# Patient Record
Sex: Female | Born: 1985 | Race: Black or African American | Hispanic: No | Marital: Single | Smoking: Never smoker
Health system: Southern US, Community
[De-identification: ages and names within clinical notes are randomized; demographics above are authoritative.]

## PROBLEM LIST (undated history)

## (undated) DIAGNOSIS — I82409 Acute embolism and thrombosis of unspecified deep veins of unspecified lower extremity: Secondary | ICD-10-CM

## (undated) DIAGNOSIS — J45909 Unspecified asthma, uncomplicated: Secondary | ICD-10-CM

## (undated) DIAGNOSIS — D6859 Other primary thrombophilia: Secondary | ICD-10-CM

## (undated) DIAGNOSIS — E079 Disorder of thyroid, unspecified: Secondary | ICD-10-CM

## (undated) HISTORY — PX: ELBOW SURGERY: SHX618

## (undated) HISTORY — DX: Disorder of thyroid, unspecified: E07.9

## (undated) NOTE — H&P (Signed)
Formatting of this note is different from the original.  [facility]    History and Physical    Assessment   Active Hospital Problems    Oligohydramnios antepartum, third trimester, fetus 14    13 year old G2P1001 @ [redacted]W[redacted]D with oligohydramnios, postdates, and previous C/S. Patient desires TOLAC. GBS NEG. BPP in office today with AFI 3.5cm. Sent to L&D for admission and IOL.    Plan   Admit to L&D. Will start pitocin IOL. Pain management as needed. Continuous monitoring.    History   Margaret Ellis is a 38 y.o. female G2P1001 at [redacted]w[redacted]d weeks with LMP 04/12/2014 with estimated due date of 01/17/2015, by Last Menstrual Period who presents with postdates and oligohydramnios.  History of Present Illness  Uncomplicated prenatal course. Sent to L&D for admission after [place] in office showed oligohydramnios. Previous C/S for breech and oligo. Patient desires TOLAC. Review of risks. All questions answered.    Membrane Status: AROM  Planned Tubal Ligation:      No  Obstetric History    G2   P1   T1   P0   A0   TAB0   SAB0   E0   M0   L1      # Outcome Date GA Lbr Len/2nd Weight Sex Delivery Anes PTL Lv   2 Current            1 Term 03/2013    F CS-LTranv Spinal N Y      Complications: Breech birth,Oligohydramnios       Past Medical History   Diagnosis Date   ? Disease of thyroid gland      Past Surgical History   Procedure Laterality Date   ? Cesarean section     ? Fractured elbow Left      at age 2     No Known Allergies  Prior to Admission medications    Medication Sig Start Date End Date Taking  Authorizing Provider   Cholecalciferol (VITAMIN D PO) Take 1 tablet by mouth.   Yes Historical Provider, MD   levothyroxine sodium (SYNTHROID, LEVOTHROID) 50 mcg tablet Take 50 mcg by mouth daily. 12/25/14  Yes Historical Provider, MD   Prenatal Multivit-Min-Fe-FA (PRENATAL VITAMINS PO) Take 1 tablet by mouth.   Yes Historical Provider, MD     Family History   Problem Relation Age of  Onset   ? Diabetes Paternal Grandmother    ? Diabetes Maternal Grandmother    ? Hypothyroidism Mother      Social History     Social History   ? Marital status: Married     Spouse name: richard   ? Number of children: 1   ? Years of education: N/A     Social History Main Topics   ? Smoking status: Never Smoker   ? Smokeless tobacco: Not on file   ? Alcohol use No   ? Drug use: Not on file   ? Sexual activity: Yes     Partners: Male     Birth control/ protection: OCP     Other Topics Concern   ? Not on file     Social History Narrative   ? No narrative on file     Review of Systems   Constitutional: Negative.    HENT: Negative.    Eyes: Negative.    Respiratory: Negative.    Cardiovascular: Negative.    Gastrointestinal: Negative.  Endocrine: Negative.    Genitourinary:        Cramping   Musculoskeletal: Negative.    Skin: Negative.    Allergic/Immunologic: Negative.    Neurological: Negative.    Hematological: Negative.    Psychiatric/Behavioral: Negative.      Physical Examination   Temp:  [97.9 F (36.6 C)] 97.9 F (36.6 C)  Heart Rate:  [69-83] 81  Resp:  [16] 16  BP: (125-144)/(85-96) 144/96  Pain Score:   6      Latest cervical exam by OB Examiner: dr Barbaraann Boys (01/25/15 1327)  Dilation: 6  Effacement (%): 100  Station: 0   AROM - scant clear fluid            Physical Exam   Constitutional: She is oriented to person, place, and time. She appears well-developed.   HENT:   Head: Normocephalic.   Cardiovascular: Normal rate and regular rhythm.    Pulmonary/Chest: Effort normal.   Abdominal: Soft. Bowel sounds are normal.   Genitourinary:   Genitourinary Comments: 6/100/-1   Musculoskeletal: Normal range of motion.   Neurological: She is oriented to person, place, and time.   Psychiatric: She has a normal mood and affect.   Nursing note and vitals reviewed.    Fetal monitoring:    Fetal Heart Rate  Mode: External [place] (01/25/15 1329 : Incoming [employer] Acute Interface)  Baseline Rate: 145 bpm (01/25/15 1329 :  Incoming [employer] Acute Interface)  Baseline Classification: No Baseline Change (01/25/15 1329 : Incoming [employer] Acute Interface)  Variability: Moderate 6-25bpm (01/25/15 1329 : Incoming [employer] Acute Interface)  Accelerations: Absent (01/25/15 1329 : Incoming [employer] Acute Interface)  Decelerations: None (01/25/15 1329 : Incoming [employer] Acute Interface)  OB Fetal Movement: Observed (01/25/15 1201 : Incoming [employer] Acute Interface)  FHR Comments: pt up walking on remote monitoring (01/25/15 1141 : Incoming [employer] Acute Interface)      Uterine activity:  Mode: External (01/25/15 1329 : Incoming [employer] Acute Interface)  Contraction Frequency (min): 2-3 (01/25/15 1329 : Incoming [employer] Devices Acute Interface)  Contraction Duration (sec): 40-60 (01/25/15 1329 : Incoming C[employer] Devices Acute Interface)  Contraction Quality: Moderate (01/25/15 1329 : Incoming C[employer] Devices Acute Interface)     Results   Prenatal Labs:  BLOOD GROUPING (no units)   Date Value   06/05/2014 O    RH FACTOR (no units)   Date Value   06/05/2014 Positive    ANTIBODY SCREEN (no units)   Date Value   06/05/2014 Negative       HGB   Date Value   01/25/2015 13.1 gm/dL   16/01/9603 54.0 g/dL   98/02/9146 82.9 g/dL    HEMATOCRIT (%)   Date Value   10/25/2014 38.3   06/05/2014 43.1     HCT (%)   Date Value   01/25/2015 39.9    No results found for: PROTUR     STREP GP B CULT/DNA PROBE (no units)   Date Value   12/26/2014 Negative       NEISSERIA GONORRHOEAE BY PCR (no units)   Date Value   06/05/2014 Negative    CHLAMYDIA TRACHOMATIS, NAA (no units)   Date Value   06/05/2014 Negative    RPR (no units)   Date Value   06/05/2014 Nonreactive       RUBELLA ANTIBODIES, IGG (no units)   Date Value   06/05/2014 Immune      HEPATITIS B SURFACE AG (no  units)   Date Value   06/05/2014 Negative    HIV (no units)   Date Value   06/05/2014 Negative     No results found for: PPD No results found for: TSH No results found for: HMPAP     No  results found for: CF No results found for: CANAVAN No components found for: BETATHALASSEMIAHB   No results found for: SICKLE  No results found for: TAYSACHS      (Electronically Signed:)Iris Oletha Blend, MD  01/25/2015 1:35 PM  Electronically signed by Kalman Drape, MD at 01/25/2015  1:36 PM EDT

## (undated) NOTE — Unmapped External Note (Signed)
Formatting of this note might be different from the original.  Cesarean Operative Note  [facility]    Date of Service: 01/25/2015    Pre-Operative Diagnosis:  1.  Intrauterine pregnancy at [redacted]w[redacted]d  2.  Oligohydramnios  3.  Previous C/S, Failed TOLAC  4. Arrest of Labor    Post-Operative Diagnosis:  1.  Intrauterine pregnancy at [redacted]w[redacted]d, delivered  2.  Oligohydramnios  3. Previous C/S, Failed TOLAC  4. Arrest of Labor    Procedure: Repeat low transverse Cesarean via Pfannenstiel incision with double-layer uterine closure    Anesthesia: Epidural    Surgeon: Kalman Drape    Assistant:     EBL: 700 cc    Fluids: 800 cc of crystalloid    Urine output: 150 cc    Complications: None apparent    Indications: Margaret Ellis is a 71 y.o. G2P2002 at [redacted]w[redacted]d weeks gestation who presented with oligohydramnios and postdates gestation.  She desired TOLAC after previous C/S.  She had subsequent arrest of labor.  Risks, benefits and alternatives of the above stated procedure were discussed with the patient and she agreed to proceed.    Findings: Single liveborn female infant, weight 7-10 Ibs, Apgars 9 at 1 min and 9 at 5 minutes in cephalic presentation.  Meconium-stained fluid.  Placenta intact with 3 vessel cord.  Normal uterus, fallopian tubes and ovaries bilaterally.  LOP presentation. Nuchal cord x 1. True Knot.    Procedure: The patient was taken to the operating room where epidural was re-bolused.  She was placed in dorsal supine position with a leftward-tilt.  A transverse skin incision was made with the scalpel and carried through to the underlying fascia which was incised in the midline and the incision extended laterally with Mayo scissors.  The fascia was dissected off the underlying rectus muscles with sharp and blunt dissection.  The peritoneum was entered sharply and the incision extended with care to avoid the bladder.  The bladder  blade was placed and the lower uterine segment visualized.  The vesicouterine peritoneum was entered sharply and the incision extended to create the bladder flap.  The bladder blade was replaced and the lower uterine segment was incised in a transverse fashion and the incision extended laterally with finger fracture.  The infant, which was in cephalic LOP presentation was delivered atraumatically, the nuchal cord was reduced. Delayed cord clamping was done. The cord was clamped and cut, and the infant handed off to the waiting Neonatologist.  The placenta was delivered with cord traction.  The uterus was exteriorized and cleared of all clots and debris.  The uterus was noted to be firm, and with uterine massage and Pitocin through the running IV, good uterine tone was achieved.  The hysterotomy was repaired with 0 Vicryl in a running locked fashion.  A second layer of 0 Vicryl was used to imbricate the incision and excellent hemostasis was noted. The the pelvis was irrigated and suctioned and the uterus returned to the abdomen.  The bilateral pericolic gutters were cleared of all clots and debris.  The hysterotomy was re-examined and good hemostasis was noted. The bladder flap was examined and no areas of bleeding noted..  The rectus muscles were examined and re-approximated with 3-0 Chromic.  The fascia was re-approximated with 0 Vicryl in a running fashion.  The subcutaneous tissue was irrigated and areas of bleeding controlled with cautery.  The subcutaneous  tissue was 0 Vicryl in a running fashion.  The skin was closed with staples and sterile dressing applied.  The patient tolerated the procedure well.  Sponge, lap and needle counts were correct at the end of the case.  The patient was taken to the recovery room in stable condition.    Specimens: None    Kalman Drape, MD  01/25/2015 / 9:09 PM      Electronically signed by Kalman Drape, MD at 01/25/2015  9:20 PM EDT

## (undated) NOTE — Anesthesia Postprocedure Evaluation (Signed)
Formatting of this note might be different from the original.    Patient: Margaret Ellis  Procedure(s):  CESAREAN SECTION  Anesthesia type: epidural    Patient location:  PACU  Patient participation:  Patient able to participate in this evaluation at age appropriate level.    Vital signs reviewed and can be found in nursing documentation.    Post vital signs:   stable  Level of consciousness:   awake, alert and oriented    Post-anesthesia pain:   adequate analgesia  Airway patency:   patent  Respiratory:   unassisted, respiration function adequate, spontaneous ventilation  Cardiovascular:   stable, blood pressure acceptable and heart rate acceptable  Hydration:   adequate hydration  Temperature: temperature adequate >96.39F  PONV:  nausea and vomiting controlled  Regional anesthesia: full recovery from regional anesthesia has not occurred and is not expected at this time    Anesthetic complications:   no  Electronically signed by Nuala Alpha, MD at 01/25/2015  8:55 PM EDT

## (undated) NOTE — Anesthesia Procedure Notes (Signed)
Associated Order(s): EPIDURAL BLOCK  Formatting of this note might be different from the original.  Epidural/Caudal Block  Patient location during procedure: OB  Date/Time: 01/25/2015 3:20 PM  Reason for block: labor pain management  Patient monitoring: HR and NIBP    Staff  Anesthesiologist: ANDRIES, LUKE  Performed by: Anesthesiologist     Epidural  Patient position: sitting  Completed: patient identified, surgical consent, pre-op evaluation, timeout performed, IV checked, risks and benefits discussed, monitors and equipment checked and hand hygiene performed  Prep: chlorhexidine gluconate and isopropyl alcohol, sterile technique and prep, sterile gloves used, sterile drape used, mask used and hand hygiene performed    Approach: midline  Location: lumbar, 3-4  Identification technique: loss of resistance - air  Needle  Needle type: Tuohy   Needle gauge: 17g  Needle length: 90mm / 3.5 in  Catheter type: multi-port  Catheter size: 22g  Test dose: negative and lidocaine 2% with epinephrine  Assessment  Sensory level T4  Post-procedure: sterile dressing/s applied and EBL minimal unless otherwise noted  Patient tolerance: Patient tolerated the procedure well with no immediate complications.      Electronically signed by Nuala Alpha, MD at 01/25/2015  3:20 PM EDT

## (undated) NOTE — Discharge Summary (Signed)
Formatting of this note is different from the original.  [facility]    Obstetrics Discharge Note    Discharge Details   Admit date:         01/25/2015  Discharge date and time:       01/28/2015  Hospital LOS:    3 days    Active Hospital Problems    Diagnosis Date Noted POA   ? Delivered by cesarean section 01/28/2015 No     Resolved Hospital Problems    Diagnosis Date Noted Date Resolved POA   ? Oligohydramnios antepartum, third trimester, fetus 1 01/25/2015 01/28/2015 Yes       Hospital Course     Hospital Physicians:  Physicians involved in care during this hospitalization  Attending Provider: Kalman Drape, MD  Admitting Provider: Kalman Drape, MD  Anesthesiologist: Nuala Alpha, MD  Consulting Physician: Erskine Speed Consult To [facility]    Admitting Diagnosis:  1.  Intrauterine pregnancy at [redacted]w[redacted]d  2.  Oligohydramnios  3.  Previous Cesarean section    Discharging Diagnosis:  1.  Intrauterine pregnancy at [redacted]w[redacted]d, delivered  2.  Oligohydramnios  3.  Arrest of dilation  4.  Previous Cesarean section, failed TOLAC    Procedures:  Bedside Procedures     No orders found       Procedure(s) (LRB):  CESAREAN SECTION (N/A)    01/25/2015    Surgeon(s):  Kalman Drape, MD  -------------------    Admission History:  The patient is a 39 y.o. G2P1001 who was admitted at [redacted]w[redacted]d for induction of labor for oligohydramnios with an AFI of 3 cm.  Previous Cesarean section for breech and oligohydramnios.  She progressed to 9 cm then had arrest of dilation and fetal decelerations and so Cesarean section ws recommended.    Procedure Details: The patient underwent repeat low-transverse Cesarean section on 01/25/15.  Procedure was uncomplicated with an estimated blood loss of 800cc.  Findings included: LOP presentation, meconium-stained fluid, nuchal cord and true knot in cord.    Delivery:  Date of Birth: 01/25/2017    Time of Birth: 7:57 PM      Information for the  patient's newborn:  Tedra, [place] [[id number]]   female    Birth History   ? Birth     Length: 20.08" (51 cm)     Weight: 7 lb 10 oz (3.459 kg)     HC 34 cm (13.39")   ? Apgar     One: 9     Five: 9   ? Delivery Method: C-Section, Low Transverse   ? Gestation Age: 5 1/7 wks   ? Duration of Labor: 1st: 3h 30m / 2nd: 2h 34m     Hospital Course:        The patient's post-op course was complicated by post-partum hemorrhage requiring bedside uterine evacuation.  Her hemoglobin dropped below 7 and she was transfused wiht 2u PRBC with appropriate rise of Hgb.  By POD#3 the patient was tolerating a regular diet, voiding spontaneously, ambulating without difficulty, regaining bowel function, with pain well-controlled on PO medications and so she was deemed stable for discharge to home.    Discharge Instructions     Other Instructions     Discharge instructions       No heavy lifting >15 lbs or strenuous exercise for 6 weeks, nothing in the vagina, no sex/tampons/douching for 6w, no driving while on narcotic pain medications.  Call the office for any fevers >100.4, heavy vaginal bleeding, severe pain, nausea or vomiting, problems with urination, breast concerns, problems with incision, concerning mood changes or any other concerns.    Return to the office for incision check in 1 week and post-partum visit in 6 weeks.           Appointments which have been scheduled     Jan 31, 2015  7:00 AM EDT   LD INDUCTION IP with [facility] LD LABOR RM 3, [facility] LD RN 3   [facility] L&D Procedures [facility])    [address]  [place] [address]   [phone]           Unresulted Labs     Order Current Status    Prepare RBC, 2 Units Preliminary result       Current Discharge Medication List     NEW medications    Details   HYDROcodone-acetaminophen (NORCO) 5-325 mg per tablet Take one tablet to two tablets by mouth every 4 (four) hours as needed.  Start date: 01/28/2015, End date: 02/07/2015      ibuprofen (ADVIL,MOTRIN) 600 mg tablet Take one tablet (600 mg total) by mouth every 6 (six) hours as needed for Pain.  Start date: 01/28/2015     iron-vitamin C (VITRON-C) 65-125 mg TABS per tablet Take one tablet by mouth daily.  Start date: 01/28/2015       CONTINUED medications    Details   Cholecalciferol (VITAMIN D PO) Take 1 tablet by mouth.     levothyroxine sodium (SYNTHROID, LEVOTHROID) 50 mcg tablet Take 50 mcg by mouth daily.     Prenatal Multivit-Min-Fe-FA (PRENATAL VITAMINS PO) Take 1 tablet by mouth.         Electronically signed:  Chelsea Primus, MD  01/28/2015 / 8:55 AM  Electronically signed by Izell Carolina at 01/28/2015  9:01 AM EDT

## (undated) NOTE — L&D Delivery Note (Signed)
Formatting of this note is different from the original.  [facility]    Labor & Delivery Summary    Maternal History   Gravida/Para:  [id number]  EDD:   01/17/2015, by Last Menstrual Period  STREP GP B CULT/DNA PROBE   Date/Time Value Ref Range Status   12/26/2014 Negative Negative Final     Active Hospital Problems    Oligohydramnios antepartum, third trimester, fetus 1    Labor Information     Cervical ripening:             Induction: Oxytocin    Indications for Induction:          Post-term Gestation     Induction Date:      Induction Time:      Augmentation:      Augmentation Date:      Augmentation Time:      Antibiotics:                    No     ROM:  Rupture date: 01/25/2015    Rupture time: 1:28 PM    ROM method:          AROM   Amniotic fluid:                               Clear  Odor: None    Complete Cervical Dilation:     01/25/2015   5:13 PM              Labor EventTimes:                     First Stage: 3  hour(s), 43  minute(s)  Second Stage: 2  hour(s), 44  minute(s)  Third Stage: 0  hour(s), 1  minute(s)    Complications: Failure To Progress In First Stage      Anesthesia Type:        Epidural     Delivery Information:   Delivery Clinician:    Kalman Drape                  Other Staff:  [place] D;SIMMONS, Vallarie Mare  Delivery Assist;Delivery Nurse      Episiotomy: None    Lacerations:  Perineal:    Repaired:         Periurethral:        Repaired:       Labial:    Repaired:       Sulcus:    Repaired:       Vaginal:    Repaired:       Cervical:     Repaired:         C-Section Details:  Primary/repeat: Repeat    Priority: Routine    Indications:  Arrest of Dilation    Incision type: Low Transverse      Total estimated blood loss (mL): 700      Baby Information:     Date of Birth: 01/25/2017    Time of Birth: 7:57 PM      Method of Delivery:                C-Section, Low Transverse   Born en Route:  VBAC:                                      Details of Assisted Delivery (if applicable)   Forceps attempted  No    Forceps indication:      Forceps type:      Application location:      Number of pulls:      Total application time:      Applied by:      Failed         Vacuum attempted  No     Vacuum indication:      Vacuum type:      Application location:      First Attempt     Time applied:      Time removed:      Second Attempt    Time applied:      Time removed:      Third Attempt    Time applied:      Time removed:      Number of pulls:      Number of pop-offs:      HIgh-end pressure range:      Total application time:      Applied by:      Failed         Presentation/Position:  Vertex ;   ; Left     Details of Shoulder Dystocia (if applicable)   Dystocia Present                        No   Time head delivered:                  01/25/2015  7:57 PM   Time body delivered:                   7:57 PM   Gentle attempt at traction, assisted by maternal expulsive forces:       If no, why:     Anterior shoulder:     Time recognized:       Time help called:    Called by:      Time provider arrived:    Provider who arrived:      Time NICU arrived:    NICU staff:      Time additional staff arrived:    Additional staff:        Maneuver                                              Time Performed                                Performed By                      Comments:    Cord Information:  3 vessels      Disposition of cord blood:  Lab          Blood gases sent      Complications: Nuchal;Knot    Nuchal intervention: reduced    Nuchal cord description: loose    Cord around: neck    Number of loops: 1  Stem cells collected by MD       No   Delayed cord clamp:                    Yes    Comments: +true knot     Placenta  Delivered:        10/8  7:58 PM   Appearance:   Intact   Removal:         Expressed     Disposition:    Lab     Newborn Assessment:  Gestational Age:    [redacted]w[redacted]d  Living: Yes    Sex:   female   Weight: 7 lb 10.4 oz (3.47 kg)                APGARS One minute Five minutes Ten minutes   Skin color: 1    1          Heart rate: 2    2          Grimace: 2    2          Muscle Tone: 2    2          Breathing: 2    2          Totals: 9    9            Resuscitation:      Neo Staff: present    Meconium: not indicated    Resuscitation Note:            Electronically Signed:  Kalman Drape, MD  01/25/2015 / 9:09 PM      Electronically signed by Kalman Drape, MD at 01/25/2015  9:09 PM EDT

## (undated) NOTE — Anesthesia Preprocedure Evaluation (Signed)
Formatting of this note is different from the original.     Anesthesia Evaluation     Airway   Mallampati: II  TM distance: >3 FB  Neck ROM: full  Dental - normal exam     Pulmonary - normal exam   Cardiovascular - normal exam    Neuro/Psych      GI/Hepatic/Renal      Endo/Other    (+) hypothyroidism, obesity,   Abdominal  - normal exam     No Known Allergies    No current facility-administered medications on file prior to encounter.      No current outpatient prescriptions on file prior to encounter.     Vitals:    01/25/15 1205   BP:    Pulse: 74   Resp: 16   Temp: 97.9 F (36.6 C)     Lab Results   Component Value Date    WBC 15.6 (H) 01/25/2015    HGB 13.1 01/25/2015    PLT CT 201 01/25/2015     Past Surgical History   Procedure Laterality Date   ? Cesarean section     ? Fractured elbow Left      at age 51         Anesthesia Plan    ASA 2     epidural     Anesthetic plan and risks discussed with patient.      Electronically signed by Nuala Alpha, MD at 01/25/2015 12:24 PM EDT

## (undated) NOTE — Progress Notes (Signed)
Formatting of this note is different from the original.  [facility]    Progress Note    Postpartum Day # 3  Interval History   Feels well.  Pain well-controlled.  Ambulating without difficulty.  Voiding spontaneously.  Passing flatus.  Lochia scant.  Breast feeding without difficulty.    Medications:   ? acetaminophen  325 mg Oral Once (May repeat)   ? docusate sodium  100 mg Oral BID   ? ibuprofen  800 mg Oral 3 times per day     Physical Examination     Vitals:    01/27/15 0800 01/27/15 1616 01/27/15 2000 01/28/15 0756   BP: 109/72 112/78 113/79 123/80   BP Location: Right arm Right arm Right arm Left arm   Patient Position: Sitting Sitting Sitting Sitting   Pulse: 73 97 88 94   Resp: 20 16 16 16    Temp: 98 F (36.7 C) 98.3 F (36.8 C) 97.9 F (36.6 C) 98.2 F (36.8 C)   TempSrc: Axillary Oral Oral Oral   SpO2: 98%      Weight:       Height:         Intakes & Outputs (Last 24 hours) at 01/28/15 0700  Last data filed at 01/27/15 1314   24hr Volume @0700    Intake      0 mL   Output   1350 mL   Net  -1350 mL     General: alert, comfortable, no distress  CV: RRR  Resp: unlabored breathing  Abd: soft, non-tender and non-distended  Fundus: Firm below umbilicus  Incision: clean, dry, intact, staples intact and no drainage or separation  Extremities: non-tender and no edema    Results   Labs:   No results found for this or any previous visit (from the past 24 hour(s)).     Assessment/Plan   89 y.o. [id number] POD#3 s/p Repeat Cesarean section, failed TOLAC    1.  Routine post-op care  2.  Acute blood loss anemia: Hemoglobin with appropriate rise after transfusion of 2u PRBC after PPH.  Will start iron on discharge.  3.  Support with breast-feeding  4.  Discharge to home    Electronically signed:  Chelsea Primus, MD  01/28/2015 / 8:49 AM      Electronically signed by Izell Carolina at 01/28/2015  8:50 AM EDT

---

## 2015-02-12 ENCOUNTER — Encounter (INDEPENDENT_AMBULATORY_CARE_PROVIDER_SITE_OTHER): Payer: Self-pay | Admitting: Internal Medicine

## 2015-02-12 ENCOUNTER — Ambulatory Visit (INDEPENDENT_AMBULATORY_CARE_PROVIDER_SITE_OTHER): Payer: BC Managed Care – PPO | Admitting: Internal Medicine

## 2015-02-12 VITALS — BP 127/81 | HR 72 | Temp 98.0°F | Resp 20 | Ht 60.0 in | Wt 135.0 lb

## 2015-02-12 DIAGNOSIS — E039 Hypothyroidism, unspecified: Secondary | ICD-10-CM

## 2015-02-12 DIAGNOSIS — Z1331 Encounter for screening for depression: Secondary | ICD-10-CM

## 2015-02-12 DIAGNOSIS — Z1389 Encounter for screening for other disorder: Secondary | ICD-10-CM

## 2015-02-12 LAB — TSH: TSH: 1.15 u[IU]/mL (ref 0.35–4.94)

## 2015-02-12 LAB — T4, FREE: T4 Free: 0.83 ng/dL (ref 0.70–1.48)

## 2015-02-12 MED ORDER — LEVOTHYROXINE SODIUM 50 MCG PO TABS
50.0000 ug | ORAL_TABLET | Freq: Every day | ORAL | Status: DC
Start: 2015-02-12 — End: 2015-09-07

## 2015-02-12 NOTE — Progress Notes (Signed)
Subjective:     Margaret Ellis is a 29 y.o. female    New patient.     Problem   Acquired Hypothyroidism    On levothyroxine once daily. Dx sicne 3 years. Reports doing stable on medication.   No constipation/ weight gain/ depression.   Out of medications since 5 days now.     Delivered baby 2 weeks ago.   No h/o thyroid surgery or radiation.          The following portions of the patient's history were reviewed and updated as appropriate: allergies, current medications, past family history, past medical history, past social history, past surgical history and problem list.  Family History   Problem Relation Age of Onset   . Hypothyroidism Mother    . Hypertension Father      Social History   Substance Use Topics   . Smoking status: Never Smoker    . Smokeless tobacco: Never Used   . Alcohol Use: No         ROS :   Constitutional: Negative for fever, chills and diaphoresis.   Respiratory: Negative for chest tightness, shortness of breath and wheezing.   Cardiovascular: Negative for chest pain, palpitations    Gastrointestinal: Negative for abdominal pain, vomiting  Musculoskeletal: Negative for arthralgias and gait problem.   Neurological: Negative for seizures and headaches.   Psychiatric/Behavioral: Negative    Objective:     Vitals reviewed:  Blood pressure 127/81, pulse 72, temperature 98 F (36.7 C), temperature source Oral, resp. rate 20, height 1.524 m (5'), weight 61.236 kg (135 lb).    Constitutional: Pt.  Well developed and well nourished  HENT:   Head: Normocephalic and atraumatic.   Neck: Normal range of motion. Neck supple. No thyroid enlargement  Cardiovascular: Normal rate and regular rhythm. No murmur heard.   Pulmonary/Chest: Effort normal and Clear breath sounds on both sides. .   Musculoskeletal: Normal range of motion.   Neurological:  No obvious focal neurological deficits.    Psych; normal mood and affect      Assessment and Plan:   1. Acquired hypothyroidism  TSH    T4, free     levothyroxine (SYNTHROID, LEVOTHROID) 50 MCG tablet   2. Depression screening       Hypothyroidism ;  -stable.   -continue current medications  -TSH/ T4 check today     F/u in 3-6 months or earlier prn .

## 2015-05-15 ENCOUNTER — Ambulatory Visit (INDEPENDENT_AMBULATORY_CARE_PROVIDER_SITE_OTHER): Payer: BC Managed Care – PPO | Admitting: Internal Medicine

## 2015-06-05 ENCOUNTER — Ambulatory Visit (INDEPENDENT_AMBULATORY_CARE_PROVIDER_SITE_OTHER): Payer: BC Managed Care – PPO | Admitting: Internal Medicine

## 2015-09-05 ENCOUNTER — Ambulatory Visit (INDEPENDENT_AMBULATORY_CARE_PROVIDER_SITE_OTHER): Payer: BLUE CROSS/BLUE SHIELD | Admitting: Family Medicine

## 2015-09-05 ENCOUNTER — Encounter (INDEPENDENT_AMBULATORY_CARE_PROVIDER_SITE_OTHER): Payer: Self-pay | Admitting: Family Medicine

## 2015-09-05 VITALS — BP 116/75 | HR 82 | Temp 98.0°F | Ht 60.0 in | Wt 132.0 lb

## 2015-09-05 DIAGNOSIS — E039 Hypothyroidism, unspecified: Secondary | ICD-10-CM

## 2015-09-06 LAB — T4, FREE: T4, Free: 1 ng/dL (ref 0.82–1.77)

## 2015-09-06 LAB — TSH: TSH: 2.46 u[IU]/mL (ref 0.450–4.500)

## 2015-09-07 ENCOUNTER — Other Ambulatory Visit (INDEPENDENT_AMBULATORY_CARE_PROVIDER_SITE_OTHER): Payer: Self-pay | Admitting: Family Medicine

## 2015-09-07 DIAGNOSIS — E039 Hypothyroidism, unspecified: Secondary | ICD-10-CM

## 2015-09-07 MED ORDER — LEVOTHYROXINE SODIUM 50 MCG PO TABS
50.0000 ug | ORAL_TABLET | Freq: Every morning | ORAL | Status: DC
Start: 2015-09-07 — End: 2015-09-11

## 2015-09-07 NOTE — Progress Notes (Signed)
Subjective:      Patient ID: Margaret Ellis is a 30 y.o. female     Chief Complaint   Patient presents with   . Thyroid Problem     follow up        HPI     Presents for f/u of thyroid disease. H/o hypothyroidism, unknown etiology. Denies thyroid symptoms, has been out of medications for a few weeks now.     The following portions of the patient's history were reviewed and updated as appropriate: allergies, current medications and problem list.    Review of Systems   Constitutional: Negative for activity change.   Cardiovascular: Negative for palpitations.   Gastrointestinal: Negative for diarrhea and constipation.   Neurological: Negative for headaches.          BP 116/75 mmHg  Pulse 82  Temp(Src) 98 F (36.7 C) (Oral)  Ht 1.524 m (5')  Wt 59.875 kg (132 lb)  BMI 25.78 kg/m2     Objective:     Physical Exam   Constitutional: She is oriented to person, place, and time. She appears well-developed and well-nourished. No distress.   Neck: Neck supple. No thyromegaly present.   Cardiovascular: Normal rate, regular rhythm and normal heart sounds.    No murmur heard.  Pulmonary/Chest: Effort normal and breath sounds normal. No respiratory distress.   Musculoskeletal: She exhibits no edema.   Neurological: She is alert and oriented to person, place, and time. She exhibits normal muscle tone.   Skin: Skin is warm and dry.          Assessment:     1. Hypothyroidism, unspecified type  - TSH  - T4, free         Plan:     Chronic. F/u Thyroid studies ordered. Medication refill pending rest results.     Emelda Brothers, MD

## 2015-09-11 ENCOUNTER — Other Ambulatory Visit (INDEPENDENT_AMBULATORY_CARE_PROVIDER_SITE_OTHER): Payer: Self-pay | Admitting: Family Medicine

## 2015-09-11 DIAGNOSIS — E039 Hypothyroidism, unspecified: Secondary | ICD-10-CM

## 2015-09-11 MED ORDER — LEVOTHYROXINE SODIUM 50 MCG PO TABS
50.0000 ug | ORAL_TABLET | Freq: Every morning | ORAL | Status: DC
Start: 2015-09-11 — End: 2016-03-05

## 2015-09-11 NOTE — Telephone Encounter (Signed)
Please check with patient's pharmacy. Script sent on 09/07/15

## 2016-02-03 ENCOUNTER — Encounter (INDEPENDENT_AMBULATORY_CARE_PROVIDER_SITE_OTHER): Payer: Self-pay | Admitting: Family Medicine

## 2016-02-03 ENCOUNTER — Ambulatory Visit (INDEPENDENT_AMBULATORY_CARE_PROVIDER_SITE_OTHER): Payer: BLUE CROSS/BLUE SHIELD | Admitting: Family Medicine

## 2016-02-03 VITALS — BP 115/72 | HR 109 | Temp 98.0°F | Resp 12 | Wt 137.0 lb

## 2016-02-03 DIAGNOSIS — B9789 Other viral agents as the cause of diseases classified elsewhere: Secondary | ICD-10-CM

## 2016-02-03 DIAGNOSIS — J069 Acute upper respiratory infection, unspecified: Secondary | ICD-10-CM

## 2016-02-03 DIAGNOSIS — J040 Acute laryngitis: Secondary | ICD-10-CM

## 2016-02-03 MED ORDER — PSEUDOEPHEDRINE-GUAIFENESIN ER 60-600 MG PO TB12
1.0000 | ORAL_TABLET | Freq: Two times a day (BID) | ORAL | 0 refills | Status: AC
Start: 2016-02-03 — End: 2016-02-10

## 2016-02-03 NOTE — Patient Instructions (Signed)
Laryngitis    Laryngitis is a swelling of the tissues around the vocal cords. Symptoms include a hoarse (scratchy) voice. The voice may be lost completely. It may be caused by a viral illness, such as a head or chest cold. It may also be due to overuse and strain of the voice.Smoking, drinking alcohol, acid reflux, allergies, or inhaling harsh chemicals may also lead to symptoms.This condition willusuallyresolve in1-2 weeks.  Home care   Rest your voice until it recovers. Talk as little as possible.If your symptoms are severe, rest at home for a day or so.   Breathing cool steam from a humidifier/vaporizer or in a steamy shower may be helpful.   Drink plenty of fluids to stay well hydrated.   Do not smoke  Follow-up care  Follow up with your healthcare provider or this facility if you have not improved after one week.  When to seek medical advice  Contact your healthcare provider for any of the following:   Severe pain with swallowing   Trouble opening mouth   Neck swelling, neckpain, or trouble moving neck   Noisy breathing or trouble breathing   Fever of 100.4F(38.C)or higher, or as directed by your healthcare provider   Drooling   Symptoms do not resolve in 2 weeks  Date Last Reviewed: 08/12/2013   2000-2016 [employer] [address], [place] [address]. All rights reserved. This information is not intended as a substitute for professional medical care. Always follow your healthcare professional's instructions.

## 2016-02-03 NOTE — Progress Notes (Signed)
Subjective:       Patient ID: Margaret Ellis is a 30 y.o. female.    HPI  C/o sore throat x 7 days ago, now resolved. Noted to have hoarseness x 5 days. Children sick at home but are now getting better. Has not gotten her flu vaccine yet. Works as a Runner, broadcasting/film/video. Denies fever, chills, myalgias, nasal congestion but has PND and occasional cough. Has not taken any meds. Unchanged.    The following portions of the patient's history were reviewed and updated as appropriate: allergies, current medications, past family history, past medical history, past social history, past surgical history and problem list.    Review of Systems   Constitutional: Negative for chills, diaphoresis, fatigue and fever.   HENT: Positive for congestion, postnasal drip and sore throat (resolved). Negative for sinus pain and sinus pressure.    Respiratory: Positive for cough. Negative for chest tightness, shortness of breath and wheezing.    Gastrointestinal: Negative for abdominal pain, diarrhea and nausea.           Objective:    Physical Exam   Constitutional: She is oriented to person, place, and time. No distress.   HENT:   Head: Normocephalic and atraumatic.   Right Ear: External ear and ear canal normal. Tympanic membrane is retracted.   Left Ear: External ear and ear canal normal. Tympanic membrane is retracted.   Nose: Mucosal edema (erythematous) present. No rhinorrhea or nasal deformity. No epistaxis. Right sinus exhibits no maxillary sinus tenderness and no frontal sinus tenderness. Left sinus exhibits no maxillary sinus tenderness and no frontal sinus tenderness.   Mouth/Throat: Uvula is midline and mucous membranes are normal. No oral lesions. Posterior oropharyngeal edema and posterior oropharyngeal erythema present. No oropharyngeal exudate or tonsillar abscesses.   Cardiovascular: Normal rate, regular rhythm, normal heart sounds and intact distal pulses.    No murmur heard.  Pulmonary/Chest: Effort normal and breath sounds normal.  No respiratory distress. She has no wheezes. She has no rales.   Lymphadenopathy:     She has no cervical adenopathy.        Right: No supraclavicular adenopathy present.        Left: No supraclavicular adenopathy present.   Neurological: She is alert and oriented to person, place, and time.   Skin: Skin is warm and dry. She is not diaphoretic.   Psychiatric: She has a normal mood and affect. Her behavior is normal. Judgment and thought content normal.   Vitals reviewed.    BP 115/72 (BP Site: Left arm, Patient Position: Sitting, Cuff Size: Medium)   Pulse (!) 109   Temp 98 F (36.7 C) (Oral)   Resp 12   Wt 62.1 kg (137 lb)   LMP 12/30/2015   SpO2 98%   BMI 26.76 kg/m       Assessment:       1. Viral URI with cough  pseudoephedrine-guaifenesin (MUCINEX D) 60-600 MG per tablet   2. Acute laryngitis  pseudoephedrine-guaifenesin (MUCINEX D) 60-600 MG per tablet         Plan:      Procedures    1. Viral URI with cough  - Discussed natural course of illness. Symptomatic treatment with OTC decongestants (MUcinex-D BID x 5-7 days), NSAIDs (Ibuprofen 400-600mg  TID prn). Increase PO fluids, rest.   - pseudoephedrine-guaifenesin (MUCINEX D) 60-600 MG per tablet; Take 1 tablet by mouth every 12 (twelve) hours.for 7 days  Dispense: 14 tablet; Refill: 0    2. Acute  laryngitis  - Due to #1. Voice rest. Given and discussed handout on diagnosis, treatment and warning signs.  Risk & Benefits of the new medication(s) were explained to the pt who appeared to understand & agree to the treatment plan.  - pseudoephedrine-guaifenesin (MUCINEX D) 60-600 MG per tablet; Take 1 tablet by mouth every 12 (twelve) hours.for 7 days  Dispense: 14 tablet; Refill: 0      RTC if symptoms persist and prn.

## 2016-02-03 NOTE — Progress Notes (Signed)
Nursing Documentation:  Limb alert status: Patient asked and denied any limb restrictions for blood pressure/blood draws.  Has the patient seen any other providers since their last visit: no  The patient is due for influenza vaccine pt refused she  Is sick

## 2016-03-05 ENCOUNTER — Other Ambulatory Visit (INDEPENDENT_AMBULATORY_CARE_PROVIDER_SITE_OTHER): Payer: Self-pay | Admitting: Family Medicine

## 2016-03-05 DIAGNOSIS — E039 Hypothyroidism, unspecified: Secondary | ICD-10-CM

## 2016-03-05 NOTE — Telephone Encounter (Signed)
Dr. Leeroy Bock, please review. I saw patient for follow up back in May. She then saw Dr. January back in Oct.

## 2016-04-07 ENCOUNTER — Ambulatory Visit (INDEPENDENT_AMBULATORY_CARE_PROVIDER_SITE_OTHER): Payer: BLUE CROSS/BLUE SHIELD | Admitting: Family Medicine

## 2016-06-11 ENCOUNTER — Other Ambulatory Visit (INDEPENDENT_AMBULATORY_CARE_PROVIDER_SITE_OTHER): Payer: Self-pay | Admitting: Internal Medicine

## 2016-06-11 DIAGNOSIS — E039 Hypothyroidism, unspecified: Secondary | ICD-10-CM

## 2016-07-23 ENCOUNTER — Ambulatory Visit (INDEPENDENT_AMBULATORY_CARE_PROVIDER_SITE_OTHER): Payer: BLUE CROSS/BLUE SHIELD | Admitting: Family Medicine

## 2016-07-29 ENCOUNTER — Ambulatory Visit (INDEPENDENT_AMBULATORY_CARE_PROVIDER_SITE_OTHER): Payer: BC Managed Care – PPO | Admitting: Family Medicine

## 2016-08-09 ENCOUNTER — Ambulatory Visit (INDEPENDENT_AMBULATORY_CARE_PROVIDER_SITE_OTHER): Payer: BLUE CROSS/BLUE SHIELD | Admitting: Family Medicine

## 2019-07-07 ENCOUNTER — Other Ambulatory Visit: Payer: Self-pay

## 2019-07-07 ENCOUNTER — Ambulatory Visit (HOSPITAL_COMMUNITY)
Admission: EM | Admit: 2019-07-07 | Discharge: 2019-07-07 | Disposition: A | Discharge status: Home / Self Care | Payer: BC Managed Care – PPO | Attending: Family Medicine | Admitting: Family Medicine

## 2019-07-07 ENCOUNTER — Encounter (HOSPITAL_COMMUNITY): Payer: Self-pay | Admitting: Emergency Medicine

## 2019-07-07 DIAGNOSIS — M545 Low back pain, unspecified: Secondary | ICD-10-CM

## 2019-07-07 HISTORY — DX: Acute embolism and thrombosis of unspecified deep veins of unspecified lower extremity: I82.409

## 2019-07-07 HISTORY — DX: Other primary thrombophilia: D68.59

## 2019-07-07 LAB — POCT URINALYSIS DIP (DEVICE)
Bilirubin Urine: NEGATIVE
Glucose, UA: NEGATIVE mg/dL
Hgb urine dipstick: NEGATIVE
Ketones, ur: NEGATIVE mg/dL
Leukocytes,Ua: NEGATIVE
Nitrite: NEGATIVE
Protein, ur: NEGATIVE mg/dL
Specific Gravity, Urine: 1.02 (ref 1.005–1.030)
Urobilinogen, UA: 0.2 mg/dL (ref 0.0–1.0)
pH: 7.5 (ref 5.0–8.0)

## 2019-07-07 MED ORDER — IBUPROFEN 600 MG PO TABS: 600.0000 mg | ORAL_TABLET | Freq: Four times a day (QID) | ORAL | 0 refills | Status: AC | PRN

## 2019-07-07 MED ORDER — METHOCARBAMOL 500 MG PO TABS: 500.0000 mg | ORAL_TABLET | Freq: Two times a day (BID) | ORAL | 0 refills | Status: AC

## 2019-07-07 NOTE — ED Triage Notes (Signed)
Pt sts right sided lower back pain worse over last three days with some fatigue

## 2019-07-07 NOTE — Discharge Instructions (Signed)
Take the prescribed ibuprofen as needed for your pain.  Take the muscle relaxer Robaxin as needed for muscle spasm; do not drive, operate machinery, or drink alcohol with this medication as it may make you drowsy.    Follow up with an orthopedist if your pain is not improving.  Go to the emergency department if you have worsening pain or develop new symptoms such as difficulty with urination, weakness, numbness, loss of control of your bladder or bowels, fever, chills or other concerns.

## 2019-07-07 NOTE — ED Provider Notes (Signed)
[facility]    CSN: [id number] Arrival date & time: 07/07/19  1728      History   Chief Complaint Chief Complaint  Patient presents with  . Back Pain    HPI Kaitlyn Brown is a 34 y.o. female.   Patient reports right-sided low back pain over the last 3 days.  Patient cannot recall any specific injury that has happened.  Denies radiating pain, denies that there is really nothing that makes the pain better, but that it is worse with activity.  Patient has not done anything to try to treat this at home.  Patient denies headache, sore throat, shortness of breath, nausea, vomiting, diarrhea, fever, rash, other symptoms.  The history is provided by the patient.    Past Medical History:  Diagnosis Date  . DVT (deep venous thrombosis) (HCC)   . Protein S deficiency (HCC)     There are no problems to display for this patient.   History reviewed. No pertinent surgical history.  OB History   No obstetric history on file.      Home Medications    Prior to Admission medications   Medication Sig Start Date End Date Taking? Authorizing Provider  ibuprofen (ADVIL) 600 MG tablet Take 1 tablet (600 mg total) by mouth every 6 (six) hours as needed. 07/07/19   Moshe Cipro, NP  methocarbamol (ROBAXIN) 500 MG tablet Take 1 tablet (500 mg total) by mouth 2 (two) times daily. 07/07/19   Moshe Cipro, NP    Family History History reviewed. No pertinent family history.  Social History Social History   Tobacco Use  . Smoking status: Never Smoker  . Smokeless tobacco: Never Used  Substance Use Topics  . Alcohol use: Not Currently  . Drug use: Never     Allergies   Patient has no known allergies.   Review of Systems Review of Systems  Constitutional: Negative.  Negative for chills and fever.  HENT: Negative.  Negative for ear pain and sore throat.   Eyes: Negative.  Negative for pain and visual disturbance.  Respiratory: Negative.  Negative for  cough and shortness of breath.   Cardiovascular: Negative.  Negative for chest pain and palpitations.  Gastrointestinal: Negative for abdominal pain and vomiting.  Endocrine: Negative.   Genitourinary: Negative.  Negative for dysuria and hematuria.  Musculoskeletal: Positive for back pain. Negative for arthralgias.       Right lower back  Skin: Negative.  Negative for color change and rash.  Allergic/Immunologic: Negative.   Neurological: Negative.  Negative for seizures and syncope.  Hematological: Negative.   Psychiatric/Behavioral: Negative.   All other systems reviewed and are negative.    Physical Exam Triage Vital Signs ED Triage Vitals  Enc Vitals Group     BP 07/07/19 1808 126/76     Pulse Rate 07/07/19 1808 (!) 105     Resp 07/07/19 1808 17     Temp 07/07/19 1808 98 F (36.7 C)     Temp Source 07/07/19 1808 Oral     SpO2 07/07/19 1808 99 %     Weight --      Height --      Head Circumference --      Peak Flow --      Pain Score 07/07/19 1820 7     Pain Loc --      Pain Edu? --      Excl. in GC? --    No data found.  Updated Vital Signs BP  126/76 (BP Location: Right Arm)   Pulse (!) 105   Temp 98 F (36.7 C) (Oral)   Resp 17   SpO2 99%   Visual Acuity Right Eye Distance:   Left Eye Distance:   Bilateral Distance:    Right Eye Near:   Left Eye Near:    Bilateral Near:     Physical Exam Vitals and nursing note reviewed.  Constitutional:      General: She is not in acute distress.    Appearance: Normal appearance. She is well-developed and normal weight. She is not ill-appearing.  HENT:     Head: Normocephalic and atraumatic.     Nose: Nose normal.     Mouth/Throat:     Mouth: Mucous membranes are moist.     Pharynx: Oropharynx is clear.  Eyes:     Extraocular Movements: Extraocular movements intact.     Conjunctiva/sclera: Conjunctivae normal.     Pupils: Pupils are equal, round, and reactive to light.  Cardiovascular:     Rate and Rhythm:  Normal rate and regular rhythm.     Heart sounds: Normal heart sounds. No murmur.  Pulmonary:     Effort: Pulmonary effort is normal. No respiratory distress.     Breath sounds: Normal breath sounds. No stridor. No wheezing, rhonchi or rales.  Chest:     Chest wall: No tenderness.  Abdominal:     General: Bowel sounds are normal. There is no distension.     Palpations: Abdomen is soft. There is no mass.     Tenderness: There is no abdominal tenderness. There is no guarding or rebound.     Hernia: No hernia is present.  Musculoskeletal:        General: Tenderness present.     Cervical back: Normal range of motion and neck supple.     Lumbar back: Spasms and tenderness present.     Comments: Spasms and tenderness to palpation to right lower back.  Skin:    General: Skin is warm and dry.     Capillary Refill: Capillary refill takes less than 2 seconds.  Neurological:     General: No focal deficit present.     Mental Status: She is alert and oriented to person, place, and time.  Psychiatric:        Mood and Affect: Mood normal.        Behavior: Behavior normal.      UC Treatments / Results  Labs (all labs ordered are listed, but only abnormal results are displayed) Labs Reviewed  POCT URINALYSIS DIP (DEVICE)    EKG   Radiology No results found.  Procedures Procedures (including critical care time)  Medications Ordered in UC Medications - No data to display  Initial Impression / Assessment and Plan / UC Course  I have reviewed the triage vital signs and the nursing notes.  Pertinent labs & imaging results that were available during my care of the patient were reviewed by me and considered in my medical decision making (see chart for details).  Clinical Course as of Jul 07 2204  Sat Jul 07, 2019  1826 POCT Urinalysis, Dipstick [SM]    Clinical Course User Index [SM] Faustino Congress, NP    Presents today with right lower back pain x3 days.  Has not been  treating at home.  UA in office today negative for infection.  No known injury.  Negative straight leg raise.  Ibuprofen 6 5 mg every 6-8 hours as needed for moderate pain  prescribed and sent to patient's pharmacy.  Also sent in Robaxin 500 mg twice daily as needed muscle spasms.  Patient was instructed to follow-up with this office or with primary care if symptoms are not improving over the next week.  She may also follow-up with orthopedics as needed.  Patient instructed to go to the ER for concerning symptoms of trouble swallowing, trouble breathing, loss of bowel or bladder, or other concerning symptoms. Final Clinical Impressions(s) / UC Diagnoses   Final diagnoses:  Acute right-sided low back pain without sciatica     Discharge Instructions     Take the prescribed ibuprofen as needed for your pain.  Take the muscle relaxer Robaxin as needed for muscle spasm; do not drive, operate machinery, or drink alcohol with this medication as it may make you drowsy.    Follow up with an orthopedist if your pain is not improving.  Go to the emergency department if you have worsening pain or develop new symptoms such as difficulty with urination, weakness, numbness, loss of control of your bladder or bowels, fever, chills or other concerns.     ED Prescriptions    Medication Sig Dispense Auth. Provider   ibuprofen (ADVIL) 600 MG tablet Take 1 tablet (600 mg total) by mouth every 6 (six) hours as needed. 30 tablet Moshe Cipro, NP   methocarbamol (ROBAXIN) 500 MG tablet Take 1 tablet (500 mg total) by mouth 2 (two) times daily. 20 tablet Moshe Cipro, NP     I have reviewed the PDMP during this encounter.   Moshe Cipro, NP 07/08/19 2208

## 2019-09-25 ENCOUNTER — Other Ambulatory Visit: Payer: Self-pay

## 2019-09-25 ENCOUNTER — Emergency Department (HOSPITAL_COMMUNITY)
Admission: EM | Admit: 2019-09-25 | Discharge: 2019-09-25 | Disposition: A | Discharge status: Home / Self Care | Payer: BC Managed Care – PPO | Attending: Emergency Medicine | Admitting: Emergency Medicine

## 2019-09-25 ENCOUNTER — Encounter (HOSPITAL_COMMUNITY): Payer: Self-pay

## 2019-09-25 DIAGNOSIS — J4521 Mild intermittent asthma with (acute) exacerbation: Secondary | ICD-10-CM | POA: Diagnosis not present

## 2019-09-25 DIAGNOSIS — R05 Cough: Secondary | ICD-10-CM | POA: Diagnosis present

## 2019-09-25 HISTORY — DX: Unspecified asthma, uncomplicated: J45.909

## 2019-09-25 MED ORDER — ALBUTEROL SULFATE HFA 108 (90 BASE) MCG/ACT IN AERS
2.0000 | INHALATION_SPRAY | Freq: Once | RESPIRATORY_TRACT | Status: AC
  Administered 2019-09-25: 2 via RESPIRATORY_TRACT
  Filled 2019-09-25: qty 6.7

## 2019-09-25 MED ORDER — PREDNISONE 10 MG PO TABS: 60.0000 mg | ORAL_TABLET | Freq: Every day | ORAL | 0 refills | Status: AC

## 2019-09-25 MED ORDER — PREDNISONE 20 MG PO TABS
60.0000 mg | ORAL_TABLET | Freq: Once | ORAL | Status: AC
  Administered 2019-09-25: 60 mg via ORAL
  Filled 2019-09-25: qty 3

## 2019-09-25 MED ORDER — AEROCHAMBER PLUS FLO-VU LARGE MISC
1.0000 | Freq: Once | Status: AC
  Administered 2019-09-25: 1

## 2019-09-25 MED ORDER — MONTELUKAST SODIUM 10 MG PO TABS: 10.0000 mg | ORAL_TABLET | Freq: Every day | ORAL | 0 refills | Status: AC

## 2019-09-25 NOTE — ED Provider Notes (Signed)
[facility] EMERGENCY DEPARTMENT Provider Note   CSN: [id number] Arrival date & time: 09/25/19  8416     History Chief Complaint  Patient presents with  . Asthma    Kaitlyn Brown is a 34 y.o. female.  34 year old female with history of asthma, states that she was in the hospital frequently as a child however has not needed to use her inhaler very much as an adult.  Patient states that she noticed on Sunday she was having chest tightness with wheezing and a dry cough, used her inhaler with some relief however her symptoms persisted and had an asthma attack at work yesterday.  Patient is feeling somewhat better at this time however concerned about recent flare of asthma.  Patient uses an albuterol inhaler, also has an albuterol nebulizer.  Patient takes Careers adviser daily.  Denies fevers, states that she is tested weekly for COVID-19 at work and has been negative.  No other complaints or concerns today.          Past Medical History:  Diagnosis Date  . Asthma   . DVT (deep venous thrombosis) (HCC)   . Protein S deficiency (HCC)     There are no problems to display for this patient.   History reviewed. No pertinent surgical history.   OB History   No obstetric history on file.     No family history on file.  Social History   Tobacco Use  . Smoking status: Never Smoker  . Smokeless tobacco: Never Used  Substance Use Topics  . Alcohol use: Not Currently  . Drug use: Never    Home Medications Prior to Admission medications   Medication Sig Start Date End Date Taking? Authorizing Provider  ibuprofen (ADVIL) 600 MG tablet Take 1 tablet (600 mg total) by mouth every 6 (six) hours as needed. 07/07/19   Moshe Cipro, NP  methocarbamol (ROBAXIN) 500 MG tablet Take 1 tablet (500 mg total) by mouth 2 (two) times daily. 07/07/19   Moshe Cipro, NP  montelukast (SINGULAIR) 10 MG tablet Take 1 tablet (10 mg total) by mouth at bedtime. 09/25/19 10/25/19   Jeannie Fend, PA-C  predniSONE (DELTASONE) 10 MG tablet Take 6 tablets (60 mg total) by mouth daily for 4 days. Start 09/26/19 09/25/19 09/29/19  Jeannie Fend, PA-C    Allergies    Patient has no known allergies.  Review of Systems   Review of Systems  Constitutional: Negative for chills and fever.  HENT: Negative for congestion, rhinorrhea, sinus pressure and sinus pain.   Respiratory: Positive for cough, chest tightness and shortness of breath.   Cardiovascular: Negative for chest pain and palpitations.  Gastrointestinal: Negative for abdominal pain.  Musculoskeletal: Negative for arthralgias and myalgias.  Skin: Negative for rash and wound.  Allergic/Immunologic: Negative for immunocompromised state.  Neurological: Negative for weakness.  All other systems reviewed and are negative.   Physical Exam Updated Vital Signs BP 114/72 (BP Location: Left Arm)   Pulse 94   Temp 98.3 F (36.8 C) (Oral)   Resp 16   SpO2 98%   Physical Exam Vitals and nursing note reviewed.  Constitutional:      General: She is not in acute distress.    Appearance: She is well-developed. She is not diaphoretic.  HENT:     Head: Normocephalic and atraumatic.  Cardiovascular:     Rate and Rhythm: Normal rate and regular rhythm.     Pulses: Normal pulses.     Heart sounds: Normal  heart sounds.  Pulmonary:     Effort: Pulmonary effort is normal.     Breath sounds: Normal breath sounds.  Musculoskeletal:     Right lower leg: No edema.     Left lower leg: No edema.  Skin:    General: Skin is warm and dry.     Findings: No erythema or rash.  Neurological:     Mental Status: She is alert and oriented to person, place, and time.  Psychiatric:        Behavior: Behavior normal.     ED Results / Procedures / Treatments   Labs (all labs ordered are listed, but only abnormal results are displayed) Labs Reviewed - No data to display  EKG None  Radiology No results  found.  Procedures Procedures (including critical care time)  Medications Ordered in ED Medications  albuterol (VENTOLIN HFA) 108 (90 Base) MCG/ACT inhaler 2 puff (2 puffs Inhalation Given 09/25/19 0815)  AeroChamber Plus Flo-Vu Large MISC 1 each (1 each Other Given 09/25/19 0815)  predniSONE (DELTASONE) tablet 60 mg (60 mg Oral Given 09/25/19 0815)    ED Course  I have reviewed the triage vital signs and the nursing notes.  Pertinent labs & imaging results that were available during my care of the patient were reviewed by me and considered in my medical decision making (see chart for details).  Clinical Course as of Sep 25 930  Tue Sep 24, 3836  5565 35 year old female with history of asthma, states that she was in the hospital frequently as a child however has not needed to use her inhaler very much as an adult.  Patient states that she noticed on Sunday she was having chest tightness with wheezing and a dry cough, used her inhaler with some relief however her symptoms persisted and had an asthma attack at work yesterday.  Patient is feeling somewhat better at this time however concerned about recent flare of asthma.  Patient uses an albuterol inhaler, also has an albuterol nebulizer.  Patient takes Allegra daily.  Denies fevers, states that she is tested weekly for COVID-19 at work and has been negative.  No other complaints or concerns today.  On exam, patient is well-appearing, lungs are clear.  Plan is to give 2 puffs of albuterol inhaler with use of a spacer and a dose of prednisone today.  Will discharge on 4 additional days of prednisone, continue use of as needed inhaler and nebulizer, start Singulair nightly.  Recommend follow-up with PCP, return to ER for new or worsening symptoms.   [LM]    Clinical Course User Index [LM] Jaken Fregia A, PA-C   MDM Rules/Calculators/A&P                      Final Clinical Impression(s) / ED Diagnoses Final diagnoses:  Mild intermittent asthma  with exacerbation    Rx / DC Orders ED Discharge Orders         Ordered    predniSONE (DELTASONE) 10 MG tablet  Daily     09/25/19 0803    montelukast (SINGULAIR) 10 MG tablet  Daily at bedtime     06 /08/21 0803           Tacy Learn, PA-C 09/25/19 3710    Varney Biles, MD 09/25/19 [phone]

## 2019-09-25 NOTE — Discharge Instructions (Addendum)
Follow up with your doctor.  Take your Allegra daily. Take Singulair at night. Take prednisone daily starting tomorrow (already given a dose today in the ER). Use your inhaler as needed as prescribed- 1-2 puffs every 4-6 hours as needed.  Return to ER for new or worsening symptoms.

## 2019-09-25 NOTE — ED Triage Notes (Signed)
Pt reports that her asthma started acting up this morning unrelieved by her inhaler

## 2019-10-12 ENCOUNTER — Other Ambulatory Visit: Payer: Self-pay

## 2019-10-12 ENCOUNTER — Encounter (HOSPITAL_COMMUNITY): Payer: Self-pay | Admitting: Obstetrics and Gynecology

## 2019-10-12 ENCOUNTER — Emergency Department (HOSPITAL_COMMUNITY)
Admission: EM | Admit: 2019-10-12 | Discharge: 2019-10-12 | Disposition: A | Discharge status: Home / Self Care | Payer: BC Managed Care – PPO | Attending: Emergency Medicine | Admitting: Emergency Medicine

## 2019-10-12 DIAGNOSIS — Z79899 Other long term (current) drug therapy: Secondary | ICD-10-CM | POA: Insufficient documentation

## 2019-10-12 DIAGNOSIS — R252 Cramp and spasm: Secondary | ICD-10-CM

## 2019-10-12 DIAGNOSIS — R109 Unspecified abdominal pain: Secondary | ICD-10-CM | POA: Diagnosis present

## 2019-10-12 DIAGNOSIS — M79662 Pain in left lower leg: Secondary | ICD-10-CM | POA: Insufficient documentation

## 2019-10-12 DIAGNOSIS — J45909 Unspecified asthma, uncomplicated: Secondary | ICD-10-CM | POA: Insufficient documentation

## 2019-10-12 DIAGNOSIS — Z86718 Personal history of other venous thrombosis and embolism: Secondary | ICD-10-CM | POA: Diagnosis not present

## 2019-10-12 LAB — I-STAT BETA HCG BLOOD, ED (MC, WL, AP ONLY): I-stat hCG, quantitative: <5 m[IU]/mL (ref <5)

## 2019-10-12 LAB — CBC
HCT: 39.5 % (ref 36.0–46.0)
Hemoglobin: 13 g/dL (ref 12.0–15.0)
MCH: 29.2 pg (ref 26.0–34.0)
MCHC: 32.9 g/dL (ref 30.0–36.0)
MCV: 88.8 fL (ref 80.0–100.0)
Platelets: 200 10*3/uL (ref 150–400)
RBC: 4.45 MIL/uL (ref 3.87–5.11)
RDW: 12.8 % (ref 11.5–15.5)
WBC: 7.4 10*3/uL (ref 4.0–10.5)
nRBC: 0 % (ref 0.0–0.2)

## 2019-10-12 LAB — COMPREHENSIVE METABOLIC PANEL
ALT: 21 U/L (ref 0–44)
AST: 21 U/L (ref 15–41)
Albumin: 3.7 g/dL (ref 3.5–5.0)
Alkaline Phosphatase: 80 U/L (ref 38–126)
Anion gap: 8 (ref 5–15)
BUN: 7 mg/dL (ref 6–20)
CO2: 25 mmol/L (ref 22–32)
Calcium: 9 mg/dL (ref 8.9–10.3)
Chloride: 105 mmol/L (ref 98–111)
Creatinine, Ser: 0.77 mg/dL (ref 0.44–1.00)
GFR calc Af Amer: >60 mL/min (ref >60)
GFR calc non Af Amer: >60 mL/min (ref >60)
Glucose, Bld: 111 mg/dL — ABNORMAL HIGH (ref 70–99)
Potassium: 4 mmol/L (ref 3.5–5.1)
Sodium: 138 mmol/L (ref 135–145)
Total Bilirubin: 1.3 mg/dL — ABNORMAL HIGH (ref 0.3–1.2)
Total Protein: 7.3 g/dL (ref 6.5–8.1)

## 2019-10-12 LAB — LIPASE, BLOOD: Lipase: 19 U/L (ref 11–51)

## 2019-10-12 MED ORDER — SODIUM CHLORIDE 0.9% FLUSH: 3.0000 mL | Freq: Once | INTRAVENOUS | Status: DC

## 2019-10-12 MED ORDER — ACETAMINOPHEN 500 MG PO TABS
1000.0000 mg | ORAL_TABLET | Freq: Once | ORAL | Status: AC
  Administered 2019-10-12: 1000 mg via ORAL
  Filled 2019-10-12: qty 2

## 2019-10-12 NOTE — Discharge Instructions (Addendum)
You may use over-the-counter Motrin (Ibuprofen), Acetaminophen (Tylenol), topical muscle creams such as SalonPas, Icy Hot, Bengay, etc. Please stretch, apply heat, and have massage therapy for additional assistance. ° °

## 2019-10-12 NOTE — ED Provider Notes (Signed)
[facility]-EMERGENCY DEPT Provider Note  CSN: [id number] Arrival date & time: 10/12/19 0132  Chief Complaint(s) Abdominal Pain  HPI Kaitlyn Brown is a 34 y.o. 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  Patient will also reports that she at times feels a hard knot in that area.  She denies any associated nausea, vomiting.  No prior abdominal surgeries.  Patient still having bowel movements and passing gas.  In addition patient also endorsing few days of left buttock and hamstring pain worse with range of motion, movement and palpation.  Patient reports waking up with the pain yesterday.  She denies any trauma or injuries.  No associated swelling.  Reports that this is different than her previous blood clot in the same leg.   HPI  Past Medical History Past Medical History:  Diagnosis Date  . Asthma   . DVT (deep venous thrombosis) (HCC)   . Protein S deficiency (HCC)    There are no problems to display for this patient.  Home Medication(s) Prior to Admission medications   Medication Sig Start Date End Date Taking? Authorizing Provider  albuterol (VENTOLIN HFA) 108 (90 Base) MCG/ACT inhaler Inhale 2 puffs into the lungs every 6 (six) hours as needed for wheezing or shortness of breath.   Yes [provider]  ibuprofen (ADVIL) 600 MG tablet Take 1 tablet (600 mg total) by mouth every 6 (six) hours as needed. Patient taking differently: Take 600 mg by mouth every 6 (six) hours as needed for headache, moderate pain or cramping.  07/07/19  Yes Moshe Cipro, NP  montelukast (SINGULAIR) 10 MG tablet Take 1 tablet (10 mg total) by mouth at bedtime. 09/25/19 10/25/19 Yes Jeannie Fend, PA-C  methocarbamol (ROBAXIN) 500 MG tablet Take 1 tablet (500 mg  total) by mouth 2 (two) times daily. Patient not taking: Reported on 10/12/2019 07/07/19   Moshe Cipro, NP                                                                                                                                    Past Surgical History History reviewed. No pertinent surgical history. Family History History reviewed. No pertinent family history.  Social History Social History   Tobacco Use  . Smoking status: Never Smoker  . Smokeless tobacco: Never Used  Substance Use Topics  . Alcohol use: Not Currently  . Drug use: Never   Allergies Patient has no known allergies.  Review of Systems Review of Systems All other systems are reviewed and are negative for acute change except as noted in the HPI  Physical Exam Vital Signs  I have reviewed the triage vital signs BP 102/72   Pulse 64   Temp 97.9 F (36.6 C) (Oral)  Resp 14   Ht 5\' 8"  (1.727 m)   Wt 98.7 kg   LMP 10/12/2019 (Exact Date)   SpO2 98%   BMI 33.09 kg/m   Physical Exam Vitals reviewed.  Constitutional:      General: She is not in acute distress.    Appearance: She is well-developed. She is not diaphoretic.  HENT:     Head: Normocephalic and atraumatic.     Right Ear: External ear normal.     Left Ear: External ear normal.     Nose: Nose normal.  Eyes:     General: No scleral icterus.    Conjunctiva/sclera: Conjunctivae normal.  Neck:     Trachea: Phonation normal.  Cardiovascular:     Rate and Rhythm: Normal rate and regular rhythm.  Pulmonary:     Effort: Pulmonary effort is normal. No respiratory distress.     Breath sounds: No stridor.  Abdominal:     General: There is no distension.     Tenderness: There is abdominal tenderness in the periumbilical area.     Hernia: No hernia is present.    Musculoskeletal:        General: Normal range of motion.     Cervical back: Normal range of motion.     Lumbar back: No tenderness or bony tenderness.     Right upper leg:  No tenderness.     Left upper leg: Tenderness present. No swelling or bony tenderness.     Right lower leg: No swelling.     Left lower leg: No swelling.       Legs:  Neurological:     Mental Status: She is alert and oriented to person, place, and time.  Psychiatric:        Behavior: Behavior normal.     ED Results and Treatments Labs (all labs ordered are listed, but only abnormal results are displayed) Labs Reviewed  COMPREHENSIVE METABOLIC PANEL - Abnormal; Notable for the following components:      Result Value   Glucose, Bld 111 (*)    Total Bilirubin 1.3 (*)    All other components within normal limits  LIPASE, BLOOD  CBC  I-STAT BETA HCG BLOOD, ED ([facility], [facility], [facility] ONLY)                                                                                                                         EKG  EKG Interpretation  Date/Time:    Ventricular Rate:    PR Interval:    QRS Duration:   QT Interval:    QTC Calculation:   R Axis:     Text Interpretation:        Radiology No results found.  Pertinent labs & imaging results that were available during my care of the patient were reviewed by me and considered in my medical decision making (see chart for details).  Medications Ordered in ED Medications  sodium chloride flush (NS) 0.9 % injection 3 mL (3  mLs Intravenous Not Given 10/12/19 0220)  acetaminophen (TYLENOL) tablet 1,000 mg (1,000 mg Oral Given 10/12/19 0448)                                                                                                                                    Procedures Procedures  (including critical care time)  Medical Decision Making / ED Course I have reviewed the nursing notes for this encounter and the patient's prior records (if available in EHR or on provided paperwork).   Kaitlyn Brown was evaluated in Emergency Department on 10/12/2019 for the symptoms described in the history of present illness. She was evaluated in the  context of the global COVID-19 pandemic, which necessitated consideration that the patient might be at risk for infection with the SARS-CoV-2 virus that causes COVID-19. Institutional protocols and algorithms that pertain to the evaluation of patients at risk for COVID-19 are in a state of rapid change based on information released by regulatory bodies including the CDC and federal and state organizations. These policies and algorithms were followed during the patient's care in the ED.  1. abd discomfort: Possible abd wall muscle spasm vs reducible hernia. No hernia noted on exam. abd relatively benign. Labs reassuring. Low suspicion for serious intra-abd inflammatory/infectious process or SBO. No need for imaging at this time.  2. Left leg pain Most suspicious for muscle strain/spasm. No swelling concerning for DVT. Offered OP [place], but she decline.     Final Clinical Impression(s) / ED Diagnoses Final diagnoses:  Abdominal wall pain  Cramp in lower leg   The patient appears reasonably screened and/or stabilized for discharge and I doubt any other medical condition or other [facility] requiring further screening, evaluation, or treatment in the ED at this time prior to discharge. Safe for discharge with strict return precautions.  Disposition: Discharge  Condition: Good  I have discussed the results, Dx and Tx plan with the patient/family who expressed understanding and agree(s) with the plan. Discharge instructions discussed at length. The patient/family was given strict return precautions who verbalized understanding of the instructions. No further questions at time of discharge.    ED Discharge Orders    None        Follow Up: [address], [place] [id number] [phone]  Schedule an appointment as soon as possible for a visit  If symptoms do not improve or  worsen      This chart was dictated using voice recognition  software.  Despite best efforts to proofread,  errors can occur which can change the documentation meaning.   Fatima Blank, MD 10/12/19 [phone]

## 2019-10-12 NOTE — ED Notes (Signed)
Pt c/o L mid abdominal pain x 2 weeks.  Pt reports it is one specific spot (left of umbilicus).  Sts intermittent nausea and denies v/d.  Sts pain increases w/ coughing.

## 2019-10-12 NOTE — ED Triage Notes (Signed)
Patient reports abdominal pain and the left buttock area hurting. Patient denies N/V/D

## 2019-10-25 ENCOUNTER — Emergency Department (HOSPITAL_COMMUNITY): Payer: BC Managed Care – PPO

## 2019-10-25 ENCOUNTER — Inpatient Hospital Stay (HOSPITAL_COMMUNITY)
Admission: EM | Admit: 2019-10-25 | Discharge: 2019-10-28 | DRG: 271 | Disposition: A | Discharge status: Home / Self Care | Payer: BC Managed Care – PPO | Attending: Vascular Surgery | Admitting: Vascular Surgery

## 2019-10-25 ENCOUNTER — Encounter (HOSPITAL_COMMUNITY): Payer: Self-pay | Admitting: Emergency Medicine

## 2019-10-25 ENCOUNTER — Other Ambulatory Visit: Payer: Self-pay

## 2019-10-25 DIAGNOSIS — Z86718 Personal history of other venous thrombosis and embolism: Secondary | ICD-10-CM

## 2019-10-25 DIAGNOSIS — I82422 Acute embolism and thrombosis of left iliac vein: Secondary | ICD-10-CM | POA: Diagnosis present

## 2019-10-25 DIAGNOSIS — I82412 Acute embolism and thrombosis of left femoral vein: Secondary | ICD-10-CM | POA: Diagnosis present

## 2019-10-25 DIAGNOSIS — D6859 Other primary thrombophilia: Secondary | ICD-10-CM | POA: Diagnosis present

## 2019-10-25 DIAGNOSIS — Z20822 Contact with and (suspected) exposure to covid-19: Secondary | ICD-10-CM | POA: Diagnosis present

## 2019-10-25 DIAGNOSIS — M79609 Pain in unspecified limb: Secondary | ICD-10-CM | POA: Diagnosis not present

## 2019-10-25 DIAGNOSIS — J45909 Unspecified asthma, uncomplicated: Secondary | ICD-10-CM | POA: Diagnosis present

## 2019-10-25 DIAGNOSIS — I82220 Acute embolism and thrombosis of inferior vena cava: Principal | ICD-10-CM | POA: Diagnosis present

## 2019-10-25 DIAGNOSIS — I82432 Acute embolism and thrombosis of left popliteal vein: Secondary | ICD-10-CM | POA: Diagnosis present

## 2019-10-25 DIAGNOSIS — I82402 Acute embolism and thrombosis of unspecified deep veins of left lower extremity: Secondary | ICD-10-CM | POA: Diagnosis not present

## 2019-10-25 DIAGNOSIS — M7989 Other specified soft tissue disorders: Secondary | ICD-10-CM

## 2019-10-25 DIAGNOSIS — Z79899 Other long term (current) drug therapy: Secondary | ICD-10-CM

## 2019-10-25 DIAGNOSIS — I82409 Acute embolism and thrombosis of unspecified deep veins of unspecified lower extremity: Secondary | ICD-10-CM | POA: Diagnosis present

## 2019-10-25 DIAGNOSIS — Z86711 Personal history of pulmonary embolism: Secondary | ICD-10-CM | POA: Diagnosis not present

## 2019-10-25 DIAGNOSIS — I824Y2 Acute embolism and thrombosis of unspecified deep veins of left proximal lower extremity: Secondary | ICD-10-CM

## 2019-10-25 DIAGNOSIS — I82452 Acute embolism and thrombosis of left peroneal vein: Secondary | ICD-10-CM | POA: Diagnosis present

## 2019-10-25 DIAGNOSIS — I2694 Multiple subsegmental pulmonary emboli without acute cor pulmonale: Secondary | ICD-10-CM

## 2019-10-25 LAB — CBC WITH DIFFERENTIAL/PLATELET
Abs Immature Granulocytes: 0.01 10*3/uL (ref 0.00–0.07)
Basophils Absolute: 0 10*3/uL (ref 0.0–0.1)
Basophils Relative: 1 %
Eosinophils Absolute: 0.2 10*3/uL (ref 0.0–0.5)
Eosinophils Relative: 4 %
HCT: 39.8 % (ref 36.0–46.0)
Hemoglobin: 12.8 g/dL (ref 12.0–15.0)
Immature Granulocytes: 0 %
Lymphocytes Relative: 25 %
Lymphs Abs: 1.4 10*3/uL (ref 0.7–4.0)
MCH: 28.6 pg (ref 26.0–34.0)
MCHC: 32.2 g/dL (ref 30.0–36.0)
MCV: 88.8 fL (ref 80.0–100.0)
Monocytes Absolute: 0.5 10*3/uL (ref 0.1–1.0)
Monocytes Relative: 9 %
Neutro Abs: 3.6 10*3/uL (ref 1.7–7.7)
Neutrophils Relative %: 61 %
Platelets: 139 10*3/uL — ABNORMAL LOW (ref 150–400)
RBC: 4.48 MIL/uL (ref 3.87–5.11)
RDW: 12.6 % (ref 11.5–15.5)
WBC: 5.8 10*3/uL (ref 4.0–10.5)
nRBC: 0 % (ref 0.0–0.2)

## 2019-10-25 LAB — BASIC METABOLIC PANEL
Anion gap: 7 (ref 5–15)
BUN: 6 mg/dL (ref 6–20)
CO2: 25 mmol/L (ref 22–32)
Calcium: 9.1 mg/dL (ref 8.9–10.3)
Chloride: 108 mmol/L (ref 98–111)
Creatinine, Ser: 0.97 mg/dL (ref 0.44–1.00)
GFR calc Af Amer: >60 mL/min (ref >60)
GFR calc non Af Amer: >60 mL/min (ref >60)
Glucose, Bld: 105 mg/dL — ABNORMAL HIGH (ref 70–99)
Potassium: 4 mmol/L (ref 3.5–5.1)
Sodium: 140 mmol/L (ref 135–145)

## 2019-10-25 LAB — I-STAT BETA HCG BLOOD, ED (MC, WL, AP ONLY): I-stat hCG, quantitative: <5 m[IU]/mL (ref <5)

## 2019-10-25 LAB — SARS CORONAVIRUS 2 BY RT PCR (HOSPITAL ORDER, PERFORMED IN ~~LOC~~ HOSPITAL LAB): SARS Coronavirus 2: NEGATIVE

## 2019-10-25 MED ORDER — HYDROCODONE-ACETAMINOPHEN 5-325 MG PO TABS
1.0000 | ORAL_TABLET | Freq: Once | ORAL | Status: AC
  Administered 2019-10-25: 1 via ORAL
  Filled 2019-10-25: qty 1

## 2019-10-25 MED ORDER — HEPARIN BOLUS VIA INFUSION
5000.0000 [IU] | Freq: Once | INTRAVENOUS | Status: AC
  Administered 2019-10-25: 5000 [IU] via INTRAVENOUS
  Filled 2019-10-25: qty 5000

## 2019-10-25 MED ORDER — HEPARIN (PORCINE) 25000 UT/250ML-% IV SOLN
1150.0000 [IU]/h | INTRAVENOUS | Status: DC
  Administered 2019-10-25: 1500 [IU]/h via INTRAVENOUS
  Filled 2019-10-25 (×2): qty 250

## 2019-10-25 MED ORDER — HEPARIN BOLUS VIA INFUSION
5000.0000 [IU] | Freq: Once | INTRAVENOUS | Status: DC
  Filled 2019-10-25: qty 5000

## 2019-10-25 MED ORDER — IOHEXOL 350 MG/ML SOLN
125.0000 mL | Freq: Once | INTRAVENOUS | Status: AC | PRN
  Administered 2019-10-25: 125 mL via INTRAVENOUS

## 2019-10-25 MED ORDER — HEPARIN (PORCINE) 25000 UT/250ML-% IV SOLN: 1500.0000 [IU]/h | INTRAVENOUS | Status: DC

## 2019-10-25 NOTE — ED Triage Notes (Signed)
Bilateral leg pain ans swollen for few days getting worse today, denies any injury.

## 2019-10-25 NOTE — H&P (Signed)
H+P    Reason for Consult:  LLE DVT Referring Physician:  Dr. Estell Harpin MRN #:  [id number]  History of Present Illness: This is a 33 y.o. 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  She also has history of pulmonary embolus.  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  She was here 1 month ago with abdominal pain.  She now has 1 day history of left lower extremity swelling and pain.  She states that the leg feels heavy.  With all of her previous blood clots which were also left-sided she has not had this significant of swelling.  She does use compression boots at home after work to aid with her chronic swelling.  She has never had tissue loss or ulceration of her bilateral lower extremities.      Past Medical History:  Diagnosis Date  . Asthma   . DVT (deep venous thrombosis) (HCC)   . Protein S deficiency (HCC)     History reviewed. No pertinent surgical history.  No Known Allergies         Prior to Admission medications   Medication Sig Start Date End Date Taking? Authorizing Provider  albuterol (VENTOLIN HFA) 108 (90 Base) MCG/ACT inhaler Inhale 2 puffs into the lungs every 6 (six) hours as needed for wheezing or shortness of breath.   Yes [provider]  fexofenadine (ALLEGRA) 60 MG tablet Take 60 mg by mouth at bedtime.   Yes [provider]  ibuprofen (ADVIL) 600 MG tablet Take 1 tablet (600 mg total) by mouth every 6 (six) hours as needed. Patient taking differently: Take 600 mg by mouth every 6 (six) hours as needed for headache, moderate pain or cramping.  07/07/19  Yes Moshe Cipro, NP  montelukast (SINGULAIR) 10 MG tablet Take 1 tablet (10 mg total) by mouth at bedtime. 09/25/19 10/25/19 Yes Jeannie Fend, PA-C  methocarbamol (ROBAXIN) 500 MG tablet Take 1 tablet (500 mg total) by mouth 2 (two) times daily. Patient not taking: Reported  on 10/12/2019 07/07/19   Moshe Cipro, NP    Social History        Socioeconomic History  . Marital status: Single    Spouse name: Not on file  . Number of children: Not on file  . Years of education: Not on file  . Highest education level: Not on file  Occupational History  . Not on file  Tobacco Use  . Smoking status: Never Smoker  . Smokeless tobacco: Never Used  Substance and Sexual Activity  . Alcohol use: Not Currently  . Drug use: Never  . Sexual activity: Not on file  Other Topics Concern  . Not on file  Social History Narrative  . Not on file   Social Determinants of Health      Financial Resource Strain:   . Difficulty of Paying Living Expenses:   Food Insecurity:   . Worried About Programme researcher, broadcasting/film/video in the Last Year:   . Barista in the Last Year:   Transportation Needs:   . Freight forwarder (Medical):   Marland Kitchen Lack of Transportation (Non-Medical):   Physical Activity:   . Days of Exercise per Week:   . Minutes of Exercise per Session:   Stress:   . Feeling of Stress :   Social Connections:   . Frequency of Communication with Friends and Family:   . Frequency of  Social Gatherings with Friends and Family:   . Attends Religious Services:   . Active Member of Clubs or Organizations:   . Attends Banker Meetings:   Marland Kitchen Marital Status:   Intimate Partner Violence:   . Fear of Current or Ex-Partner:   . Emotionally Abused:   Marland Kitchen Physically Abused:   . Sexually Abused:      No family history on file.  ROS:  Cardiovascular: []  chest pain/pressure []  palpitations []  SOB lying flat []  DOE []  pain in legs while walking []  pain in legs at rest []  pain in legs at night []  non-healing ulcers [x]  hx of DVT [x]  swelling in legs  Pulmonary: []  productive cough []  asthma/wheezing []  home O2  Neurologic: [x]  weakness in []  arms [x]  legs []  numbness in []  arms []  legs []  hx of CVA []  mini stroke [] difficulty  speaking or slurred speech []  temporary loss of vision in one eye []  dizziness  Hematologic: []  hx of cancer []  bleeding problems []  problems with blood clotting easily  Endocrine:   []  diabetes []  thyroid disease  GI []  vomiting blood []  blood in stool  GU: []  CKD/renal failure []  HD--[]  M/W/F or []  T/T/S []  burning with urination []  blood in urine  Psychiatric: []  anxiety []  depression  Musculoskeletal: []  arthritis []  joint pain  Integumentary: []  rashes []  ulcers  Constitutional: []  fever []  chills   Physical Examination     Vitals:   10/25/19 1041  BP: 119/89  Pulse: 95  Resp: 16  Temp: 97.8 F (36.6 C)  SpO2: 100%   Body mass index is 33.09 kg/m.  General:  nad HENT: WNL, normocephalic Pulmonary: normal non-labored breathing Cardiac: palpable pedal pulses Abdomen:  soft, mild ttp Extremities: left lower extremity non pitting edema Neurologic: A&O X 3; Appropriate Affect ; SENSATION: normal; MOTOR FUNCTION:  moving all extremities equally. Speech is fluent/normal   CBC Labs (Brief)          Component Value Date/Time   WBC 5.8 10/25/2019 1412   RBC 4.48 10/25/2019 1412   HGB 12.8 10/25/2019 1412   HCT 39.8 10/25/2019 1412   PLT 139 (L) 10/25/2019 1412   MCV 88.8 10/25/2019 1412   MCH 28.6 10/25/2019 1412   MCHC 32.2 10/25/2019 1412   RDW 12.6 10/25/2019 1412   LYMPHSABS 1.4 10/25/2019 1412   MONOABS 0.5 10/25/2019 1412   EOSABS 0.2 10/25/2019 1412   BASOSABS 0.0 10/25/2019 1412      BMET Labs (Brief)          Component Value Date/Time   NA 140 10/25/2019 1412   K 4.0 10/25/2019 1412   CL 108 10/25/2019 1412   CO2 25 10/25/2019 1412   GLUCOSE 105 (H) 10/25/2019 1412   BUN 6 10/25/2019 1412   CREATININE 0.97 10/25/2019 1412   CALCIUM 9.1 10/25/2019 1412   GFRNONAA >60 10/25/2019 1412   GFRAA >60 10/25/2019 1412      COAGS: Recent Labs  No results found for: INR, PROTIME      Non-Invasive Vascular Imaging:   DVT duplex Summary:   RIGHT:  - No evidence of common femoral vein obstruction.    LEFT:  - Findings consistent with acute deep vein thrombosis involving the left  external iliac vein, common femoral vein, SF junction, left femoral vein,  left proximal profunda vein, left popliteal vein, left peroneal veins, and  left gastrocnemius veins.  - Findings consistent with acute superficial vein thrombosis involving the  left great saphenous vein.  - No cystic structure found in the popliteal fossa.      ASSESSMENT/PLAN: This is a 34 y.o. female with a history of multiple previous DVTs now with extensive left lower extremity DVT and swelling which is worse than her previous swelling.  All previous DVTs were left-sided.  She has also had ongoing abdominal pain for 1 month.  Given all of her symptoms we will get CT venogram abdomen and pelvis and plan intervention vs medical mgmt from there.   Amia Rynders C. Randie Heinz, MD [facility] Office: [phone] Pager: [phone]   Addendum: CT venogram IMPRESSION  VASCULAR 1. Positive for venous thrombus involving the IVC, left iliac veins and the proximal left femoral veins. In addition, there is evidence for bilateral pulmonary emboli. Nonocclusive thrombus in the IVC has marginal attachment to the IVC and/or left iliac venous thrombus and at risk for additional pulmonary embolism.  NON-VASCULAR  1. Low-density structures involving the posterior aspect of the vagina. These may represent Bartholin cysts, right side greater than left. 2. Prominent lymph nodes in the left inguinal region and proximal left thigh are likely reactive from the deep venous thrombosis. 3. Cholelithiasis. No evidence for gallbladder inflammation.  CT venogram images and radiologist interpretation reviewed.  I have discussed the results with the patient and we will plan for left lower  extremity venography with possible mechanical thrombectomy and possible stenting if there is indeed a May Thurner component tomorrow in the [facility] lab.  She will be n.p.o. past midnight.  Heparin drip initiated in the emergency department and Covid test has been ordered.  Jessic Standifer C. Randie Heinz, MD

## 2019-10-25 NOTE — ED Notes (Signed)
Departure Condition note charted by error disregard note at 2155

## 2019-10-25 NOTE — ED Notes (Signed)
Heparin verified with Hosie Spangle

## 2019-10-25 NOTE — Consult Note (Addendum)
Hospital Consult    Reason for Consult:  LLE DVT Referring Physician:  Dr. Estell Harpin MRN #:  [id number]  History of Present Illness: This is a 34 y.o. 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  She also has history of pulmonary embolus.  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  She was here 1 month ago with abdominal pain.  She now has 1 day history of left lower extremity swelling and pain.  She states that the leg feels heavy.  With all of her previous blood clots which were also left-sided she has not had this significant of swelling.  She does use compression boots at home after work to aid with her chronic swelling.  She has never had tissue loss or ulceration of her bilateral lower extremities.  Past Medical History:  Diagnosis Date  . Asthma   . DVT (deep venous thrombosis) (HCC)   . Protein S deficiency (HCC)     History reviewed. No pertinent surgical history.  No Known Allergies  Prior to Admission medications   Medication Sig Start Date End Date Taking? Authorizing Provider  albuterol (VENTOLIN HFA) 108 (90 Base) MCG/ACT inhaler Inhale 2 puffs into the lungs every 6 (six) hours as needed for wheezing or shortness of breath.   Yes [provider]  fexofenadine (ALLEGRA) 60 MG tablet Take 60 mg by mouth at bedtime.   Yes [provider]  ibuprofen (ADVIL) 600 MG tablet Take 1 tablet (600 mg total) by mouth every 6 (six) hours as needed. Patient taking differently: Take 600 mg by mouth every 6 (six) hours as needed for headache, moderate pain or cramping.  07/07/19  Yes Moshe Cipro, NP  montelukast (SINGULAIR) 10 MG tablet Take 1 tablet (10 mg total) by mouth at bedtime. 09/25/19 10/25/19 Yes Jeannie Fend, PA-C  methocarbamol (ROBAXIN) 500 MG tablet Take 1 tablet (500 mg total) by mouth 2 (two) times daily. Patient not taking: Reported on 10/12/2019  07/07/19   Moshe Cipro, NP    Social History   Socioeconomic History  . Marital status: Single    Spouse name: Not on file  . Number of children: Not on file  . Years of education: Not on file  . Highest education level: Not on file  Occupational History  . Not on file  Tobacco Use  . Smoking status: Never Smoker  . Smokeless tobacco: Never Used  Substance and Sexual Activity  . Alcohol use: Not Currently  . Drug use: Never  . Sexual activity: Not on file  Other Topics Concern  . Not on file  Social History Narrative  . Not on file   Social Determinants of Health   Financial Resource Strain:   . Difficulty of Paying Living Expenses:   Food Insecurity:   . Worried About Programme researcher, broadcasting/film/video in the Last Year:   . Barista in the Last Year:   Transportation Needs:   . Freight forwarder (Medical):   Marland Kitchen Lack of Transportation (Non-Medical):   Physical Activity:   . Days of Exercise per Week:   . Minutes of Exercise per Session:   Stress:   . Feeling of Stress :   Social Connections:   . Frequency of Communication with Friends and Family:   . Frequency of Social Gatherings with Friends and Family:   . Attends Religious Services:   . Active Member of  Clubs or Organizations:   . Attends Banker Meetings:   Marland Kitchen Marital Status:   Intimate Partner Violence:   . Fear of Current or Ex-Partner:   . Emotionally Abused:   Marland Kitchen Physically Abused:   . Sexually Abused:      No family history on file.  ROS:  Cardiovascular: []  chest pain/pressure []  palpitations []  SOB lying flat []  DOE []  pain in legs while walking []  pain in legs at rest []  pain in legs at night []  non-healing ulcers [x]  hx of DVT [x]  swelling in legs  Pulmonary: []  productive cough []  asthma/wheezing []  home O2  Neurologic: [x]  weakness in []  arms [x]  legs []  numbness in []  arms []  legs []  hx of CVA []  mini stroke [] difficulty speaking or slurred speech []   temporary loss of vision in one eye []  dizziness  Hematologic: []  hx of cancer []  bleeding problems []  problems with blood clotting easily  Endocrine:   []  diabetes []  thyroid disease  GI []  vomiting blood []  blood in stool  GU: []  CKD/renal failure []  HD--[]  M/W/F or []  T/T/S []  burning with urination []  blood in urine  Psychiatric: []  anxiety []  depression  Musculoskeletal: []  arthritis []  joint pain  Integumentary: []  rashes []  ulcers  Constitutional: []  fever []  chills   Physical Examination  Vitals:   10/25/19 1041  BP: 119/89  Pulse: 95  Resp: 16  Temp: 97.8 F (36.6 C)  SpO2: 100%   Body mass index is 33.09 kg/m.  General:  nad HENT: WNL, normocephalic Pulmonary: normal non-labored breathing Cardiac: palpable pedal pulses Abdomen:  soft, mild ttp Extremities: left lower extremity non pitting edema Neurologic: A&O X 3; Appropriate Affect ; SENSATION: normal; MOTOR FUNCTION:  moving all extremities equally. Speech is fluent/normal   CBC    Component Value Date/Time   WBC 5.8 10/25/2019 1412   RBC 4.48 10/25/2019 1412   HGB 12.8 10/25/2019 1412   HCT 39.8 10/25/2019 1412   PLT 139 (L) 10/25/2019 1412   MCV 88.8 10/25/2019 1412   MCH 28.6 10/25/2019 1412   MCHC 32.2 10/25/2019 1412   RDW 12.6 10/25/2019 1412   LYMPHSABS 1.4 10/25/2019 1412   MONOABS 0.5 10/25/2019 1412   EOSABS 0.2 10/25/2019 1412   BASOSABS 0.0 10/25/2019 1412    BMET    Component Value Date/Time   NA 140 10/25/2019 1412   K 4.0 10/25/2019 1412   CL 108 10/25/2019 1412   CO2 25 10/25/2019 1412   GLUCOSE 105 (H) 10/25/2019 1412   BUN 6 10/25/2019 1412   CREATININE 0.97 10/25/2019 1412   CALCIUM 9.1 10/25/2019 1412   GFRNONAA >60 10/25/2019 1412   GFRAA >60 10/25/2019 1412    COAGS: No results found for: INR, PROTIME   Non-Invasive Vascular Imaging:   DVT duplex Summary:   RIGHT:  - No evidence of common femoral vein obstruction.    LEFT:  -  Findings consistent with acute deep vein thrombosis involving the left  external iliac vein, common femoral vein, SF junction, left femoral vein,  left proximal profunda vein, left popliteal vein, left peroneal veins, and  left gastrocnemius veins.  - Findings consistent with acute superficial vein thrombosis involving the  left great saphenous vein.  - No cystic structure found in the popliteal fossa.      ASSESSMENT/PLAN: This is a 34 y.o. female with a history of multiple previous DVTs now with extensive left lower extremity DVT and swelling which is worse than  her previous swelling.  All previous DVTs were left-sided.  She has also had ongoing abdominal pain for 1 month.  Given all of her symptoms we will get CT venogram abdomen and pelvis and plan intervention vs medical mgmt from there.   Martell Mcfadyen C. Randie Heinz, MD [facility] Office: [phone] Pager: [phone]   Addendum: CT venogram IMPRESSION  VASCULAR 1. Positive for venous thrombus involving the IVC, left iliac veins and the proximal left femoral veins. In addition, there is evidence for bilateral pulmonary emboli. Nonocclusive thrombus in the IVC has marginal attachment to the IVC and/or left iliac venous thrombus and at risk for additional pulmonary embolism.  NON-VASCULAR  1. Low-density structures involving the posterior aspect of the vagina. These may represent Bartholin cysts, right side greater than left. 2. Prominent lymph nodes in the left inguinal region and proximal left thigh are likely reactive from the deep venous thrombosis. 3. Cholelithiasis.  No evidence for gallbladder inflammation.  CT venogram images and radiologist interpretation reviewed.  I have discussed the results with the patient and we will plan for left lower extremity venography with possible mechanical thrombectomy and possible stenting if there is indeed a May Thurner component tomorrow in the [facility] lab.   She will be n.p.o. past midnight.  Heparin drip initiated in the emergency department and Covid test has been ordered.  Latrice Storlie C. Randie Heinz, MD

## 2019-10-25 NOTE — ED Notes (Signed)
Admittnig at bedside.

## 2019-10-25 NOTE — Progress Notes (Signed)
Left lower extremity venous duplex has been completed. Preliminary results can be found in CV Proc through chart review.  Results were given to [facility] PA.  10/25/19 2:13 PM Olen Cordial RVT

## 2019-10-25 NOTE — ED Provider Notes (Signed)
[facility] EMERGENCY DEPARTMENT Provider Note   CSN: [id number] Arrival date & time: 10/25/19  1036     History Chief Complaint  Patient presents with   Leg Pain    Kaitlyn Brown is a 34 y.o. female with a past medical history of protein S deficiency, prior DVT and PE presenting to the ED with a chief complaint of left leg swelling.  She states that she was seen last week for left hip pain but denied any swelling at the time.  Yesterday started noticing pain throughout her entire left lower extremity and woke up this morning with swelling.  She is concerned because she has a history of DVT which was last diagnosed in 2018.  She was pregnant at the time and was treated with Lovenox.  She was told to take a daily aspirin but is otherwise not on any blood thinners.  She denies any chest pain, shortness of breath, hemoptysis.  States that she has 2 jobs and tries to stay active, reports a recent long car ride of 1-1/2 hours.  Denies any injuries or falls, numbness or weakness, fevers.  HPI     Past Medical History:  Diagnosis Date   Asthma    DVT (deep venous thrombosis) (HCC)    Protein S deficiency (HCC)     There are no problems to display for this patient.   History reviewed. No pertinent surgical history.   OB History   No obstetric history on file.     No family history on file.  Social History   Tobacco Use   Smoking status: Never Smoker   Smokeless tobacco: Never Used  Substance Use Topics   Alcohol use: Not Currently   Drug use: Never    Home Medications Prior to Admission medications   Medication Sig Start Date End Date Taking? Authorizing Provider  albuterol (VENTOLIN HFA) 108 (90 Base) MCG/ACT inhaler Inhale 2 puffs into the lungs every 6 (six) hours as needed for wheezing or shortness of breath.   Yes [provider]  fexofenadine (ALLEGRA) 60 MG tablet Take 60 mg by mouth at bedtime.   Yes [provider]    ibuprofen (ADVIL) 600 MG tablet Take 1 tablet (600 mg total) by mouth every 6 (six) hours as needed. Patient taking differently: Take 600 mg by mouth every 6 (six) hours as needed for headache, moderate pain or cramping.  07/07/19  Yes Moshe Cipro, NP  montelukast (SINGULAIR) 10 MG tablet Take 1 tablet (10 mg total) by mouth at bedtime. 09/25/19 10/25/19 Yes Jeannie Fend, PA-C  methocarbamol (ROBAXIN) 500 MG tablet Take 1 tablet (500 mg total) by mouth 2 (two) times daily. Patient not taking: Reported on 10/12/2019 07/07/19   Moshe Cipro, NP    Allergies    Patient has no known allergies.  Review of Systems   Review of Systems  Constitutional: Negative for appetite change, chills and fever.  HENT: Negative for ear pain, rhinorrhea, sneezing and sore throat.   Eyes: Negative for photophobia and visual disturbance.  Respiratory: Negative for cough, chest tightness, shortness of breath and wheezing.   Cardiovascular: Positive for leg swelling. Negative for chest pain and palpitations.  Gastrointestinal: Negative for abdominal pain, blood in stool, constipation, diarrhea, nausea and vomiting.  Genitourinary: Negative for dysuria, hematuria and urgency.  Musculoskeletal: Negative for myalgias.  Skin: Negative for rash.  Neurological: Negative for dizziness, weakness and light-headedness.    Physical Exam Updated Vital Signs BP 105/89 (BP Location:  Left Arm)    Pulse 85    Temp 98.5 F (36.9 C) (Oral)    Resp 18    Ht 5\' 8"  (1.727 m)    Wt 98.7 kg    LMP 10/12/2019 (Exact Date)    SpO2 99%    BMI 33.09 kg/m   Physical Exam Vitals and nursing note reviewed.  Constitutional:      General: She is not in acute distress.    Appearance: She is well-developed.  HENT:     Head: Normocephalic and atraumatic.     Nose: Nose normal.  Eyes:     General: No scleral icterus.       Left eye: No discharge.     Conjunctiva/sclera: Conjunctivae normal.  Cardiovascular:     Rate and  Rhythm: Normal rate and regular rhythm.     Heart sounds: Normal heart sounds. No murmur heard.  No friction rub. No gallop.   Pulmonary:     Effort: Pulmonary effort is normal. No respiratory distress.     Breath sounds: Normal breath sounds.  Abdominal:     General: Bowel sounds are normal. There is no distension.     Palpations: Abdomen is soft.     Tenderness: There is no abdominal tenderness. There is no guarding.  Musculoskeletal:        General: Normal range of motion.     Cervical back: Normal range of motion and neck supple.     Right lower leg: No edema.     Left lower leg: Edema present.     Comments: Nonpitting edema to left lower extremity with diffuse tenderness.  2+ DP pulses noted bilaterally.  Normal range of motion of bilateral ankles, knees and hips.  Normal sensation to light touch.  Skin:    General: Skin is warm and dry.     Findings: No rash.  Neurological:     Mental Status: She is alert.     Motor: No abnormal muscle tone.     Coordination: Coordination normal.     ED Results / Procedures / Treatments   Labs (all labs ordered are listed, but only abnormal results are displayed) Labs Reviewed  BASIC METABOLIC PANEL - Abnormal; Notable for the following components:      Result Value   Glucose, Bld 105 (*)    All other components within normal limits  CBC WITH DIFFERENTIAL/PLATELET - Abnormal; Notable for the following components:   Platelets 139 (*)    All other components within normal limits  SARS CORONAVIRUS 2 BY RT PCR (HOSPITAL ORDER, PERFORMED IN [facility])  I-STAT BETA HCG BLOOD, ED ([facility], [facility], [facility] ONLY)    EKG None  Radiology CT VENOGRAM ABD/PEL  Result Date: 10/25/2019 CLINICAL DATA:  34 year old with history DVT and protein S deficiency. Left lower extremity swelling and pain. EXAM: CT VENOGRAM ABD-PELVIS TECHNIQUE: Multidetector CT imaging of the abdomen and pelvis was performed using the standard CT venogram protocol during  bolus administration of intravenous contrast. Multiplanar reconstructed images and MIPs were obtained and reviewed to evaluate the vascular anatomy. CONTRAST:  125mL OMNIPAQUE IOHEXOL 350 MG/ML SOLN COMPARISON:  None. FINDINGS: VASCULAR IVC: Thrombus in the IVC extending from the renal veins to the distal IVC. This IVC clot is nonocclusive and a portion of the clot is within the central aspect of the IVC. This clot is probably adhered along the left posterior aspect of the distal IVC or connected to clot within the left iliac venous system.  Iliac veins: Thrombus involving the left common iliac vein. Iliac venous thrombus is probably connected with the nonocclusive thrombus in the IVC. Mild narrowing of the left common iliac vein from the right common iliac artery. Thrombus in the left common iliac vein does not appear to be completely occlusive. However, there is enlargement of the left external iliac vein due to thrombus. The thrombus within the left external iliac vein is likely occlusive. Right iliac veins are patent. Proximal femoral veins: Evidence for thrombus in the left common femoral vein, left profunda femoral vein and probably within the proximal left GSV. Subcutaneous edema in the proximal left thigh related to the DVT. Right proximal femoral veins are patent. Portal venous system: Portal venous system is patent. Arterial structures: Limited evaluation on this venous phase of imaging. Normal caliber of the abdominal aorta. Main mesenteric arteries appear to be patent. No gross abnormality to the iliac arteries or proximal femoral arteries. Review of the MIP images confirms the above findings. NON-VASCULAR Lower chest: Few tiny pleural-based densities in the lower lobes are likely incidental in a patient of this age. No significant consolidation or airspace disease in the lower lungs. No large pleural effusions. Probable pulmonary embolism in left lower lobe pulmonary artery on sequence 3, image 1.  Possible thrombus involving a right lower lobe pulmonary artery. Hepatobiliary: 7 mm calcified gallstone. Normal appearance of the liver without discrete lesion or biliary dilatation. Pancreas: Unremarkable. No pancreatic ductal dilatation or surrounding inflammatory changes. Spleen: Normal in size without focal abnormality. Adrenals/Urinary Tract: Normal appearance of the adrenal glands. 1 cm low-density structure in the mid/lower pole of the left kidney is suggestive for a cyst. No hydronephrosis. Normal appearance of the urinary bladder. Stomach/Bowel: Stomach is within normal limits. There is probably a small normal appendix but this is poorly characterized. No evidence of bowel wall thickening, distention, or inflammatory changes. Lymphatic: No significant abdominal or pelvic lymphadenopathy. Prominent lymph nodes in the left proximal thigh and inguinal region are likely reactive. Reproductive: Uterus and bilateral adnexa are unremarkable. Low-density structures in the posterior vaginal region. Poorly defined low-density structure in the right labial region measures up to 1.9 cm and may represent a Bartholin cyst. There may also be a small Bartholin's cyst on the left side. Other: Negative for ascites.  Negative for free air. Musculoskeletal: No acute bone abnormality. IMPRESSION: VASCULAR 1. Positive for venous thrombus involving the IVC, left iliac veins and the proximal left femoral veins. In addition, there is evidence for bilateral pulmonary emboli. Nonocclusive thrombus in the IVC has marginal attachment to the IVC and/or left iliac venous thrombus and at risk for additional pulmonary embolism. NON-VASCULAR 1. Low-density structures involving the posterior aspect of the vagina. These may represent Bartholin cysts, right side greater than left. 2. Prominent lymph nodes in the left inguinal region and proximal left thigh are likely reactive from the deep venous thrombosis. 3. Cholelithiasis.  No evidence  for gallbladder inflammation. These results were called by telephone at the time of interpretation on 10/25/2019 at 5:41 pm to provider Dr. Mancel Bale, Who verbally acknowledged these results. Electronically Signed   By: Richarda Overlie M.D.   On: 10/25/2019 17:55   VAS [place] LOWER EXTREMITY VENOUS (DVT) (ONLY [facility] & [facility] 7a-7p)  Result Date: 10/25/2019  Lower Venous DVTStudy Indications: Pain, and Swelling.  Risk Factors: Protein S deficiency. Limitations: Poor ultrasound/tissue interface, bowel gas and body habitus. Comparison Study: No prior studies. Performing Technologist: Chanda Busing RVT  Examination Guidelines: A complete  evaluation includes B-mode imaging, spectral Doppler, color Doppler, and power Doppler as needed of all accessible portions of each vessel. Bilateral testing is considered an integral part of a complete examination. Limited examinations for reoccurring indications may be performed as noted. The reflux portion of the exam is performed with the patient in reverse Trendelenburg.  +-----+---------------+---------+-----------+----------+--------------+  RIGHT Compressibility Phasicity Spontaneity Properties Thrombus Aging  +-----+---------------+---------+-----------+----------+--------------+  CFV   Full            Yes       Yes                                    +-----+---------------+---------+-----------+----------+--------------+                                                                         +-----+---------------+---------+-----------+----------+--------------+                                                                         +-----+---------------+---------+-----------+----------+--------------+ Distal IVC appears patent.  +---------+---------------+---------+-----------+----------+--------------+  LEFT      Compressibility Phasicity Spontaneity Properties Thrombus Aging  +---------+---------------+---------+-----------+----------+--------------+  CFV       None            No         No                     Acute           +---------+---------------+---------+-----------+----------+--------------+  SFJ       None            No        No                     Acute           +---------+---------------+---------+-----------+----------+--------------+  FV Prox   None            No        No                     Acute           +---------+---------------+---------+-----------+----------+--------------+  FV Mid    None            No        No                     Acute           +---------+---------------+---------+-----------+----------+--------------+  FV Distal None            No        No                     Acute           +---------+---------------+---------+-----------+----------+--------------+  PFV       None  No        No                     Acute           +---------+---------------+---------+-----------+----------+--------------+  POP       None            No        No                     Acute           +---------+---------------+---------+-----------+----------+--------------+  PTV       Full                                                             +---------+---------------+---------+-----------+----------+--------------+  PERO      Partial                                          Acute           +---------+---------------+---------+-----------+----------+--------------+  Gastroc   Partial                                          Acute           +---------+---------------+---------+-----------+----------+--------------+  GSV       None                                             Acute           +---------+---------------+---------+-----------+----------+--------------+  EIV                       No        No                     Acute           +---------+---------------+---------+-----------+----------+--------------+  CIV                                                        Not visualized  +---------+---------------+---------+-----------+----------+--------------+ Distal  IVC appears patent.   Summary: RIGHT: - No evidence of common femoral vein obstruction.  LEFT: - Findings consistent with acute deep vein thrombosis involving the left external iliac vein, common femoral vein, SF junction, left femoral vein, left proximal profunda vein, left popliteal vein, left peroneal veins, and left gastrocnemius veins. - Findings consistent with acute superficial vein thrombosis involving the left great saphenous vein. - No cystic structure found in the popliteal fossa.  *See table(s) above for measurements and observations.    Preliminary     Procedures .Critical Care Performed by: Dietrich Pates, PA-C Authorized by: Dietrich Pates, PA-C   Critical care provider statement:    Critical care  time (minutes):  45   Critical care was necessary to treat or prevent imminent or life-threatening deterioration of the following conditions:  Circulatory failure, respiratory failure and cardiac failure   Critical care was time spent personally by me on the following activities:  Development of treatment plan with patient or surrogate, discussions with consultants, evaluation of patient's response to treatment, examination of patient, obtaining history from patient or surrogate, ordering and performing treatments and interventions, ordering and review of laboratory studies, ordering and review of radiographic studies, pulse oximetry, re-evaluation of patient's condition and review of old charts   I assumed direction of critical care for this patient from another provider in my specialty: no     (including critical care time)  Medications Ordered in ED Medications  heparin bolus via infusion 5,000 Units (has no administration in time range)  heparin ADULT infusion 100 units/mL (25000 units/287mL sodium chloride 0.45%) (has no administration in time range)  HYDROcodone-acetaminophen (NORCO/VICODIN) 5-325 MG per tablet 1 tablet (1 tablet Oral Given 10/25/19 1258)  iohexol (OMNIPAQUE) 350 MG/ML  injection 125 mL (125 mLs Intravenous Contrast Given 10/25/19 1650)    ED Course  I have reviewed the triage vital signs and the nursing notes.  Pertinent labs & imaging results that were available during my care of the patient were reviewed by me and considered in my medical decision making (see chart for details).  Clinical Course as of Oct 25 1903  Thu Oct 25, 2019  1434 SpO2: 100 % [HK]  1445 Ultrasound positive for DVT:    [HK]  1514 Spoke to Dr. Randie Heinz of vascular surgery who will see the patient in consult.   [HK]  1551 Dr. Randie Heinz requesting CT venogram of the abdomen and pelvis which I have ordered.  I have consulted CT tech who states that there are 2 patients ahead of this    [HK]  1551 Spoke to Dr. Randie Heinz who will admit the patient for possible procedure tomorrow.  I have ordered heparin.   [HK]    Clinical Course User Index [HK] Dietrich Pates, PA-C   MDM Rules/Calculators/A&P                          34 year old female with a history of DVT and PE currently on a baby aspirin daily, history of protein S deficiency presenting to the ED with a chief complaint of left leg swelling.  Noticed for the past 2 days that she has had pain and swelling in her left lower extremity.  Her prior DVT was in her left lower extremity as well back in 2018.  She denies any chest pain, shortness of breath, hemoptysis.  On exam there is edema noted to the left lower extremity with normal distal pulses.  She has normal sensation.  No wounds noted.  Full range of motion of joints.  Ultrasound done here is positive for extensive DVT up to the external iliac vein.  Per vascular surgery recommendations CT venogram of the abdomen pelvis was done which showed clot in her IVC as well as bilateral PEs.  Patient remains hemodynamically stable, not tachycardic or tachypneic.  I have ordered heparin per pharmacy consult.  Dr. Randie Heinz will admit patient to his service.   Portions of this note were generated with Administrator, sports. Dictation errors may occur despite best attempts at proofreading.  Final Clinical Impression(s) / ED Diagnoses Final diagnoses:  Acute deep vein thrombosis (DVT) of proximal vein  of left lower extremity (HCC)  Multiple subsegmental pulmonary emboli without acute cor pulmonale [facility])    Rx / DC Orders ED Discharge Orders    None       Dietrich Pates, PA-C 10/25/19 Kerrin Champagne, MD 10/26/19 [phone]

## 2019-10-25 NOTE — Progress Notes (Addendum)
ANTICOAGULATION CONSULT NOTE - Initial Consult  Pharmacy Consult for heparin Indication: DVT/ PE   No Known Allergies  Patient Measurements: Height: 5\' 8"  (172.7 cm) Weight: 98.7 kg (217 lb 9.5 oz) IBW/kg (Calculated) : 63.9  Vital Signs: Temp: 98.5 F (36.9 C) (07/08 1757) Temp Source: Oral (07/08 1757) BP: 105/89 (07/08 1757) Pulse Rate: 85 (07/08 1757)  Labs: Recent Labs    10/25/19 1412  HGB 12.8  HCT 39.8  PLT 139*  CREATININE 0.97    Estimated Creatinine Clearance: 99.4 mL/min (by C-G formula based on SCr of 0.97 mg/dL).   Medical History: Past Medical History:  Diagnosis Date  . Asthma   . DVT (deep venous thrombosis) (HCC)   . Protein S deficiency (HCC)     Medications:  (Not in a hospital admission)   Assessment: 62 YOF with h/o of multiple DVTs and a PE found to have protein S deficiency here with lower extremity swelling. She is found to have a new left DVT and acute PE. Pharmacy consulted to start IV heparin. H/H wnl. Plt slightly low.   Goal of Therapy:  Heparin level 0.3-0.7 units/ml Monitor platelets by anticoagulation protocol: Yes   Plan:  -Heparin 5000 units IV bolus followed by IV heparin at 1500 units/hr -F/u 6 hr HL -Monitor daily HL, CBC and s/s of bleeding  31, PharmD., BCPS, BCCCP Clinical Pharmacist Clinical phone for 10/25/19 until 11:30pm: [phone] If after 11:30pm, please refer to [facility] for unit-specific pharmacist

## 2019-10-26 ENCOUNTER — Encounter (HOSPITAL_COMMUNITY)
Admission: EM | Disposition: A | Discharge status: Home / Self Care | Payer: Self-pay | Source: Home / Self Care | Attending: Vascular Surgery

## 2019-10-26 HISTORY — PX: LOWER EXTREMITY VENOGRAPHY: CATH118253

## 2019-10-26 LAB — CBC
HCT: 36.8 % (ref 36.0–46.0)
Hemoglobin: 12.1 g/dL (ref 12.0–15.0)
MCH: 28.8 pg (ref 26.0–34.0)
MCHC: 32.9 g/dL (ref 30.0–36.0)
MCV: 87.6 fL (ref 80.0–100.0)
Platelets: 140 10*3/uL — ABNORMAL LOW (ref 150–400)
RBC: 4.2 MIL/uL (ref 3.87–5.11)
RDW: 12.9 % (ref 11.5–15.5)
WBC: 7.5 10*3/uL (ref 4.0–10.5)
nRBC: 0 % (ref 0.0–0.2)

## 2019-10-26 LAB — BASIC METABOLIC PANEL
Anion gap: 7 (ref 5–15)
BUN: 7 mg/dL (ref 6–20)
CO2: 25 mmol/L (ref 22–32)
Calcium: 8.9 mg/dL (ref 8.9–10.3)
Chloride: 105 mmol/L (ref 98–111)
Creatinine, Ser: 0.84 mg/dL (ref 0.44–1.00)
GFR calc Af Amer: >60 mL/min (ref >60)
GFR calc non Af Amer: >60 mL/min (ref >60)
Glucose, Bld: 130 mg/dL — ABNORMAL HIGH (ref 70–99)
Potassium: 3.8 mmol/L (ref 3.5–5.1)
Sodium: 137 mmol/L (ref 135–145)

## 2019-10-26 LAB — HEPARIN LEVEL (UNFRACTIONATED)
Heparin Unfractionated: 0.9 IU/mL — ABNORMAL HIGH (ref 0.30–0.70)
Heparin Unfractionated: 0.88 IU/mL — ABNORMAL HIGH (ref 0.30–0.70)
Heparin Unfractionated: 0.71 IU/mL — ABNORMAL HIGH (ref 0.30–0.70)

## 2019-10-26 LAB — HIV ANTIBODY (ROUTINE TESTING W REFLEX): HIV Screen 4th Generation wRfx: NONREACTIVE

## 2019-10-26 LAB — POCT ACTIVATED CLOTTING TIME
Activated Clotting Time: 142 seconds
Activated Clotting Time: 285 seconds

## 2019-10-26 SURGERY — LOWER EXTREMITY VENOGRAPHY
Anesthesia: LOCAL | Laterality: Left

## 2019-10-26 MED ORDER — HEPARIN SODIUM (PORCINE) 1000 UNIT/ML IJ SOLN
INTRAMUSCULAR | Status: DC | PRN
  Administered 2019-10-26: 8000 [IU] via INTRAVENOUS
  Administered 2019-10-26: 2000 [IU] via INTRAVENOUS

## 2019-10-26 MED ORDER — LIDOCAINE HCL (PF) 1 % IJ SOLN
INTRAMUSCULAR | Status: AC
  Filled 2019-10-26: qty 30

## 2019-10-26 MED ORDER — HEPARIN (PORCINE) IN NACL 1000-0.9 UT/500ML-% IV SOLN
INTRAVENOUS | Status: DC | PRN
  Administered 2019-10-26: 500 mL

## 2019-10-26 MED ORDER — LIDOCAINE HCL (PF) 1 % IJ SOLN
INTRAMUSCULAR | Status: DC | PRN
  Administered 2019-10-26: 10 mL via INTRADERMAL

## 2019-10-26 MED ORDER — ALBUTEROL SULFATE (2.5 MG/3ML) 0.083% IN NEBU: 3.0000 mL | INHALATION_SOLUTION | Freq: Four times a day (QID) | RESPIRATORY_TRACT | Status: DC | PRN

## 2019-10-26 MED ORDER — PHENOL 1.4 % MT LIQD: 1.0000 | OROMUCOSAL | Status: DC | PRN

## 2019-10-26 MED ORDER — ONDANSETRON HCL 4 MG/2ML IJ SOLN: 4.0000 mg | Freq: Four times a day (QID) | INTRAMUSCULAR | Status: DC | PRN

## 2019-10-26 MED ORDER — SODIUM CHLORIDE 0.9 % WEIGHT BASED INFUSION
1.0000 mL/kg/h | INTRAVENOUS | Status: AC
  Administered 2019-10-26: 1 mL/kg/h via INTRAVENOUS

## 2019-10-26 MED ORDER — METOPROLOL TARTRATE 5 MG/5ML IV SOLN: 2.0000 mg | INTRAVENOUS | Status: DC | PRN

## 2019-10-26 MED ORDER — HYDRALAZINE HCL 20 MG/ML IJ SOLN: 5.0000 mg | INTRAMUSCULAR | Status: DC | PRN

## 2019-10-26 MED ORDER — OXYCODONE HCL 5 MG PO TABS
5.0000 mg | ORAL_TABLET | ORAL | Status: DC | PRN
  Administered 2019-10-26 – 2019-10-27 (×5): 10 mg via ORAL
  Filled 2019-10-26 (×5): qty 2

## 2019-10-26 MED ORDER — MIDAZOLAM HCL 2 MG/2ML IJ SOLN
INTRAMUSCULAR | Status: DC | PRN
  Administered 2019-10-26 (×5): 1 mg via INTRAVENOUS

## 2019-10-26 MED ORDER — MIDAZOLAM HCL 2 MG/2ML IJ SOLN
INTRAMUSCULAR | Status: AC
  Filled 2019-10-26: qty 2

## 2019-10-26 MED ORDER — LABETALOL HCL 5 MG/ML IV SOLN: 10.0000 mg | INTRAVENOUS | Status: DC | PRN

## 2019-10-26 MED ORDER — GUAIFENESIN-DM 100-10 MG/5ML PO SYRP: 15.0000 mL | ORAL_SOLUTION | ORAL | Status: DC | PRN

## 2019-10-26 MED ORDER — SODIUM CHLORIDE 0.9 % IV SOLN: INTRAVENOUS | Status: DC

## 2019-10-26 MED ORDER — ALUM & MAG HYDROXIDE-SIMETH 200-200-20 MG/5ML PO SUSP: 15.0000 mL | ORAL | Status: DC | PRN

## 2019-10-26 MED ORDER — HEPARIN (PORCINE) 25000 UT/250ML-% IV SOLN: 1150.0000 [IU]/h | INTRAVENOUS | Status: DC

## 2019-10-26 MED ORDER — MONTELUKAST SODIUM 10 MG PO TABS
10.0000 mg | ORAL_TABLET | Freq: Every day | ORAL | Status: DC
  Administered 2019-10-26 – 2019-10-27 (×3): 10 mg via ORAL
  Filled 2019-10-26 (×3): qty 1

## 2019-10-26 MED ORDER — PANTOPRAZOLE SODIUM 40 MG PO TBEC
40.0000 mg | DELAYED_RELEASE_TABLET | Freq: Every day | ORAL | Status: DC
  Administered 2019-10-27 – 2019-10-28 (×2): 40 mg via ORAL
  Filled 2019-10-26 (×2): qty 1

## 2019-10-26 MED ORDER — FENTANYL CITRATE (PF) 100 MCG/2ML IJ SOLN
INTRAMUSCULAR | Status: AC
  Filled 2019-10-26: qty 2

## 2019-10-26 MED ORDER — SODIUM CHLORIDE 0.9 % IV SOLN: 250.0000 mL | INTRAVENOUS | Status: DC | PRN

## 2019-10-26 MED ORDER — MORPHINE SULFATE (PF) 2 MG/ML IV SOLN: 2.0000 mg | INTRAVENOUS | Status: DC | PRN

## 2019-10-26 MED ORDER — SODIUM CHLORIDE 0.9% FLUSH
3.0000 mL | Freq: Two times a day (BID) | INTRAVENOUS | Status: DC
  Administered 2019-10-27 (×2): 3 mL via INTRAVENOUS

## 2019-10-26 MED ORDER — ACETAMINOPHEN 325 MG PO TABS
650.0000 mg | ORAL_TABLET | ORAL | Status: DC | PRN
  Administered 2019-10-27 – 2019-10-28 (×2): 650 mg via ORAL
  Filled 2019-10-26 (×2): qty 2

## 2019-10-26 MED ORDER — SODIUM CHLORIDE 0.9% FLUSH: 3.0000 mL | INTRAVENOUS | Status: DC | PRN

## 2019-10-26 MED ORDER — POTASSIUM CHLORIDE CRYS ER 20 MEQ PO TBCR: 20.0000 meq | EXTENDED_RELEASE_TABLET | Freq: Once | ORAL | Status: DC

## 2019-10-26 MED ORDER — FENTANYL CITRATE (PF) 100 MCG/2ML IJ SOLN
INTRAMUSCULAR | Status: DC | PRN
  Administered 2019-10-26 (×2): 50 ug via INTRAVENOUS
  Administered 2019-10-26 (×5): 25 ug via INTRAVENOUS

## 2019-10-26 MED ORDER — HEPARIN (PORCINE) 25000 UT/250ML-% IV SOLN: 1100.0000 [IU]/h | INTRAVENOUS | Status: DC

## 2019-10-26 MED ORDER — METHOCARBAMOL 500 MG PO TABS: 500.0000 mg | ORAL_TABLET | Freq: Two times a day (BID) | ORAL | Status: DC

## 2019-10-26 MED ORDER — IODIXANOL 320 MG/ML IV SOLN
INTRAVENOUS | Status: DC | PRN
  Administered 2019-10-26: 25 mL via INTRAVENOUS

## 2019-10-26 SURGICAL SUPPLY — 24 items
BAG SNAP BAND KOVER 36X36 (MISCELLANEOUS) ×1 IMPLANT
BALLN ATLAS 14X40X75 (BALLOONS) ×2
BALLN MUSTANG 10X80X75 (BALLOONS) ×2
BALLOON ATLAS 14X40X75 (BALLOONS) IMPLANT
BALLOON MUSTANG 10X80X75 (BALLOONS) IMPLANT
CATH ANGIO 5F BER2 100CM (CATHETERS) ×1 IMPLANT
CATH RETRIEVER CLOT 16MMX105CM (CATHETERS) ×1 IMPLANT
CATH VISIONS PV .035 IVUS (CATHETERS) ×1 IMPLANT
COVER DOME SNAP 22 D (MISCELLANEOUS) ×1 IMPLANT
GLIDEWIRE NITREX 0.018X80X5 (WIRE) ×1
GUIDEWIRE NITREX 0.018X80X5 (WIRE) IMPLANT
KIT ENCORE 26 ADVANTAGE (KITS) ×1 IMPLANT
KIT MICROPUNCTURE NIT STIFF (SHEATH) ×1 IMPLANT
PROTECTION STATION PRESSURIZED (MISCELLANEOUS) ×2
SHEATH CLOT RETRIEVER (SHEATH) ×1 IMPLANT
SHEATH PINNACLE 8F 10CM (SHEATH) ×1 IMPLANT
SHEATH PROBE COVER 6X72 (BAG) ×1 IMPLANT
SHIELD RADPAD SCOOP 12X17 (MISCELLANEOUS) ×1 IMPLANT
STATION PROTECTION PRESSURIZED (MISCELLANEOUS) IMPLANT
STENT WALLSTENTÂ 16X90X75 (Permanent Stent) ×1 IMPLANT
TRAY PV CATH (CUSTOM PROCEDURE TRAY) ×2 IMPLANT
WIRE AMPLATZ SS-J .035X260CM (WIRE) ×1 IMPLANT
WIRE BENTSON .035X145CM (WIRE) ×1 IMPLANT
WIRE TORQFLEX AUST .018X40CM (WIRE) ×1 IMPLANT

## 2019-10-26 NOTE — Progress Notes (Signed)
ANTICOAGULATION CONSULT NOTE  Pharmacy Consult for heparin Indication: DVT/ PE   No Known Allergies  Patient Measurements: Height: 5\' 8"  (172.7 cm) Weight: 98.7 kg (217 lb 9.5 oz) IBW/kg (Calculated) : 63.9  Heparin dosing wt: 85 kg  Vital Signs: Temp: 98.5 F (36.9 C) (07/08 1757) Temp Source: Oral (07/08 1757) BP: 105/89 (07/08 1757) Pulse Rate: 85 (07/08 1757)  Labs: Recent Labs    10/25/19 1412 10/26/19 0205  HGB 12.8  --   HCT 39.8  --   PLT 139*  --   HEPARINUNFRC  --  0.90*  CREATININE 0.97  --     Estimated Creatinine Clearance: 99.4 mL/min (by C-G formula based on SCr of 0.97 mg/dL).   Assessment: 71 YOF with h/o of multiple DVTs and a PE found to have protein S deficiency here with lower extremity swelling. She is found to have a new left DVT and acute PE. Pharmacy consulted to start IV heparin. H/H wnl. Plt slightly low.   Heparin level supratherapeutic (0.9) on gtt at 1500 units/hr. No bleeding noted.  Goal of Therapy:  Heparin level 0.3-0.7 units/ml Monitor platelets by anticoagulation protocol: Yes   Plan:  -Decrease heparin to 1350 units/hr -F/u 6 hr HL  31, PharmD, BCPS Please see amion for complete clinical pharmacist phone list 10/26/2019 2:48 AM

## 2019-10-26 NOTE — Interval H&P Note (Signed)
History and Physical Interval Note:  10/26/2019 1:37 PM  Kaitlyn Brown  has presented today for surgery, with the diagnosis of DVT.  The various methods of treatment have been discussed with the patient and family. After consideration of risks, benefits and other options for treatment, the patient has consented to  Procedure(s): LOWER EXTREMITY VENOGRAPHY, POSSIBLE THROMBECTOMY (Left) as a surgical intervention.  The patient's history has been reviewed, patient examined, no change in status, stable for surgery.  I have reviewed the patient's chart and labs.  Questions were answered to the patient's satisfaction.     Durene Cal

## 2019-10-26 NOTE — H&P (View-Only) (Signed)
  Progress Note    10/26/2019 8:29 AM * No surgery date entered *  Subjective: Left leg pain much improved  Vitals:   10/26/19 0430 10/26/19 0819  BP: (!) 82/53 104/70  Pulse: 87 90  Resp: 18 16  Temp: 98.6 F (37 C) 98.6 F (37 C)  SpO2: 98% 98%    Physical Exam: Awake alert oriented On the respirations Abdomen is soft Left lower extremity swelling improved from yesterday  CBC    Component Value Date/Time   WBC 7.5 10/26/2019 0205   RBC 4.20 10/26/2019 0205   HGB 12.1 10/26/2019 0205   HCT 36.8 10/26/2019 0205   PLT 140 (L) 10/26/2019 0205   MCV 87.6 10/26/2019 0205   MCH 28.8 10/26/2019 0205   MCHC 32.9 10/26/2019 0205   RDW 12.9 10/26/2019 0205   LYMPHSABS 1.4 10/25/2019 1412   MONOABS 0.5 10/25/2019 1412   EOSABS 0.2 10/25/2019 1412   BASOSABS 0.0 10/25/2019 1412    BMET    Component Value Date/Time   NA 137 10/26/2019 0205   K 3.8 10/26/2019 0205   CL 105 10/26/2019 0205   CO2 25 10/26/2019 0205   GLUCOSE 130 (H) 10/26/2019 0205   BUN 7 10/26/2019 0205   CREATININE 0.84 10/26/2019 0205   CALCIUM 8.9 10/26/2019 0205   GFRNONAA >60 10/26/2019 0205   GFRAA >60 10/26/2019 0205    INR No results found for: INR  No intake or output data in the 24 hours ending 10/26/19 0829   Assessment/plan:  34 y.o. female is here with extensive left lower extremity DVT.  She does have multiple previous histories of left lower extremity DVT with known protein S deficiency.  She will need anticoagulation on discharge and will likely need new compression stockings as well.  Plan is for venography with possible invention today in the PV lab.   Yareli Carthen C. Randie Heinz, MD [facility] Office: [phone] Pager: [phone]  10/26/2019 8:29 AM

## 2019-10-26 NOTE — Progress Notes (Signed)
ANTICOAGULATION CONSULT NOTE - Follow Up Consult  Pharmacy Consult for heparin Indication: PE/DVT  Labs: Recent Labs    10/25/19 1412 10/26/19 0205 10/26/19 1026 10/26/19 2228  HGB 12.8 12.1  --   --   HCT 39.8 36.8  --   --   PLT 139* 140*  --   --   HEPARINUNFRC  --  0.90* 0.88* 0.71*  CREATININE 0.97 0.84  --   --     Assessment: 34yo female slightly supratherapeutic on heparin after resumed at lower rate; no gtt issues or signs of bleeding per RN.  Goal of Therapy:  Heparin level 0.3-0.7 units/ml   Plan:  Will decrease heparin gtt slightly to 1100 units/hr and check level with am labs.     Vernard Gambles, PharmD, BCPS  10/26/2019,11:54 PM

## 2019-10-26 NOTE — Progress Notes (Addendum)
ANTICOAGULATION CONSULT NOTE  Pharmacy Consult for heparin Indication: DVT/ PE   No Known Allergies  Patient Measurements: Height: 5\' 8"  (172.7 cm) Weight: 98.7 kg (217 lb 9.5 oz) IBW/kg (Calculated) : 63.9  Heparin dosing wt: 85 kg  Vital Signs: Temp: 97.7 F (36.5 C) (07/09 1626) Temp Source: Oral (07/09 1626) BP: 105/77 (07/09 1626) Pulse Rate: 82 (07/09 1626)  Labs: Recent Labs    10/25/19 1412 10/26/19 0205 10/26/19 1026  HGB 12.8 12.1  --   HCT Kaitlyn.8 36.8  --   PLT 139* 140*  --   HEPARINUNFRC  --  0.90* 0.88*  CREATININE 0.97 0.84  --     Estimated Creatinine Clearance: 114.8 mL/min (by C-G formula based on SCr of 0.84 mg/dL).   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 She is found to have a new left DVT and acute PE. Pharmacy consulted to start IV heparin.   Patient underwent lower extremity venography today with stent placement. Pharmacy consulted to resume heparin after procedure. Confirmed with RN that patient returned to floor and heparin is infusing at 1150 units/hr. Earlier today, patient's heparin infusion rate was lowered to 1150 units/hr due to a supratherapeutic heparin level - will continue this rate and obtain heparin level.   Goal of Therapy:  Heparin level 0.3-0.7 units/ml Monitor platelets by anticoagulation protocol: Yes   Plan:  Continue heparin at 1150 units/hr  Obtain a 6 hour heparin level at 2300 Monitor heparin level, CBC, and S/S of bleeding daily   31, PharmD Clinical Pharmacist  10/26/2019 4:Kaitlyn PM

## 2019-10-26 NOTE — Op Note (Signed)
Patient name: Kaitlyn Brown MRN: [id number] DOB: 1985/10/17 Sex: female  10/26/2019 Pre-operative Diagnosis: Left leg DVT Post-operative diagnosis:  Same Surgeon:  Durene Cal Procedure Performed:  1.  Ultrasound-guided access, left popliteal vein  2.  Intravascular ultrasound (IVUS) of the anterior vena cava, left common iliac, external iliac, common femoral, femoral, and popliteal vein  3.  Mechanical thrombectomy using the INARI device of the inferior vena cava, left common iliac, external iliac, common femoral, femoral, and popliteal vein  4.  Balloon venoplasty of the left common iliac, external iliac, common femoral, femoral, and popliteal vein  5.  Venogram of the left popliteal, femoral, common femoral, external iliac, and common iliac vein as well as inferior vena cava  6.  Conscious sedation, 116 minutes  7.  Stent of the left common iliac and external iliac vein   Indications: The patient has protein S deficiency.  She has had several prior DVTs.  She came in with worsening left leg swelling.  CT scan and ultrasound identified thrombus within the inferior vena cava as well as the iliac system and left leg.  She comes in for further evaluation.  Procedure:  The patient was identified in the holding area and taken to room 8.  The patient was then placed supine on the table and prepped and draped in the usual sterile fashion.  A time out was called.  Conscious sedation was administered with the use of IV fentanyl and Versed under continuous physician and nurse monitoring.  Heart rate, blood pressure, and oxygen saturation were continuously monitored.  Total sedation time was 118 minutes.  1% lidocaine was used for local anesthesia.  The left popliteal vein was evaluated with ultrasound.  It was noncompressible with visible thrombus.  The vein was then cannulated under ultrasound guidance with a micropuncture needle.  An 018 wire was advanced and a micropuncture sheath was placed.  A  Bentson wire was then inserted.  The micropuncture sheath was removed and a Berenstein 2 catheter was inserted.  This was used to navigate the catheter and wire into the inferior vena cava.  An Amplatz superstiff wire was then advanced into the innominate vein.  An 8 French sheath was then inserted.  I then performed IVUS of the popliteal, left femoral, common femoral, external iliac and common iliac vein as well as the inferior vena cava.  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  The 8 French sheath was removed and the INARI sheath was inserted.  The patient was rebolused with heparin.  ACT was confirmed to be above 250.  4 passes of the [facility] mechanical thrombectomy device were performed in all 4 quadrants.  A combination of acute and chronic thrombus was evacuated.  I then inserted the IVUS catheter and reimaged.  This showed that the clot within the inferior vena cava was essentially resolved.  There still remain chronic thrombus within the common iliac and external iliac vein however there was now a flow channel.  There was also a flow channel within the common femoral and femoral veins but the popliteal artery showed residual chronic thrombus.  I then performed balloon venoplasty of the left common and external iliac vein as well as the common femoral femoral popliteal vein with a 10 x 80 Mustang balloon.  The INARI device was then reinserted and a repeat mechanical thrombectomy was performed.  The IVUS  catheter was then replaced and used to mark the confluence of the common iliac veins and vena cava.  I then used a 14 x 40 Atlas balloon in the common and external iliac veins for predilation.  A 16 x 90 Wallstent was then inserted landing in the proximal common iliac vein.  It was postdilated with a 14 mm Atlas balloon.  The IVUS catheter was then inserted.  This showed that the iliac veins  and the stents were widely patent.  I then performed a venogram which showed inline flow through the popliteal femoral, common femoral, external iliac, and common iliac veins as well as the inferior vena cava.  There was slight narrowing of the common femoral vein however I did not want to extend another stent below the inguinal ligament given her history.  I elected to stop at this time.  The sheath was removed using a piece of tubing and suture closure with a 2-0 nylon.  She tolerated the procedure well. Impression:  #1  Thrombus was visualized in the inferior vena cava which had both acute and chronic components to it.  This went up to the renal veins.  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  After mechanical thrombectomy the thrombus was nearly resolved in the inferior vena cava.  I stented the may Thurner with a 16 x 9 Wallstent and performed balloon venoplasty of the common femoral femoral and popliteal veins.  At completion there was now inline flow from the popliteal vein through the stents into the vena cava.  #2  I discussed with the patient that with her history she needs lifelong anticoagulation    V. Durene Cal, M.D., [facility] [place] Office: [phone] Pager:  [phone]

## 2019-10-26 NOTE — Progress Notes (Signed)
ANTICOAGULATION CONSULT NOTE  Pharmacy Consult for heparin Indication: DVT/ PE   No Known Allergies  Patient Measurements: Height: 5\' 8"  (172.7 cm) Weight: 98.7 kg (217 lb 9.5 oz) IBW/kg (Calculated) : 63.9  Heparin dosing wt: 85 kg  Vital Signs: Temp: 98.6 F (37 C) (07/09 0819) Temp Source: Oral (07/09 0819) BP: 104/70 (07/09 0819) Pulse Rate: 90 (07/09 0819)  Labs: Recent Labs    10/25/19 1412 10/26/19 0205  HGB 12.8 12.1  HCT 39.8 36.8  PLT 139* 140*  HEPARINUNFRC  --  0.90*  CREATININE 0.97 0.84    Estimated Creatinine Clearance: 114.8 mL/min (by C-G formula based on SCr of 0.84 mg/dL).   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 She is found to have a new left DVT and acute PE. Pharmacy consulted to start IV heparin.   Heparin level remains supratherapeutic S/p rate decrease to 1350 units/hr, H/H wnl, plts 140.  Plan for venography with [facility] today, possible intervention.    Goal of Therapy:  Heparin level 0.3-0.7 units/ml Monitor platelets by anticoagulation protocol: Yes   Plan:  Decrease heparin gtt to 1150 units/hr F/u 6 hour heparin level F/u long term AC plan  31, PharmD Clinical Pharmacist Please check AMION for all [facility] Pharmacy numbers 10/26/2019 11:21 AM

## 2019-10-26 NOTE — Progress Notes (Signed)
°  Progress Note    10/26/2019 8:29 AM * No surgery date entered *  Subjective: Left leg pain much improved  Vitals:   10/26/19 0430 10/26/19 0819  BP: (!) 82/53 104/70  Pulse: 87 90  Resp: 18 16  Temp: 98.6 F (37 C) 98.6 F (37 C)  SpO2: 98% 98%    Physical Exam: Awake alert oriented On the respirations Abdomen is soft Left lower extremity swelling improved from yesterday  CBC    Component Value Date/Time   WBC 7.5 10/26/2019 0205   RBC 4.20 10/26/2019 0205   HGB 12.1 10/26/2019 0205   HCT 36.8 10/26/2019 0205   PLT 140 (L) 10/26/2019 0205   MCV 87.6 10/26/2019 0205   MCH 28.8 10/26/2019 0205   MCHC 32.9 10/26/2019 0205   RDW 12.9 10/26/2019 0205   LYMPHSABS 1.4 10/25/2019 1412   MONOABS 0.5 10/25/2019 1412   EOSABS 0.2 10/25/2019 1412   BASOSABS 0.0 10/25/2019 1412    BMET    Component Value Date/Time   NA 137 10/26/2019 0205   K 3.8 10/26/2019 0205   CL 105 10/26/2019 0205   CO2 25 10/26/2019 0205   GLUCOSE 130 (H) 10/26/2019 0205   BUN 7 10/26/2019 0205   CREATININE 0.84 10/26/2019 0205   CALCIUM 8.9 10/26/2019 0205   GFRNONAA >60 10/26/2019 0205   GFRAA >60 10/26/2019 0205    INR No results found for: INR  No intake or output data in the 24 hours ending 10/26/19 0829   Assessment/plan:  33 y.o. female is here with extensive left lower extremity DVT.  She does have multiple previous histories of left lower extremity DVT with known protein S deficiency.  She will need anticoagulation on discharge and will likely need new compression stockings as well.  Plan is for venography with possible invention today in the PV lab.   Genelle Economou C. Shamonique Battiste, MD [facility] [place] Office: [phone] Pager: [phone]  10/26/2019 8:29 AM   

## 2019-10-27 LAB — CBC
HCT: 33.8 % — ABNORMAL LOW (ref 36.0–46.0)
Hemoglobin: 11.2 g/dL — ABNORMAL LOW (ref 12.0–15.0)
MCH: 29 pg (ref 26.0–34.0)
MCHC: 33.1 g/dL (ref 30.0–36.0)
MCV: 87.6 fL (ref 80.0–100.0)
Platelets: 128 10*3/uL — ABNORMAL LOW (ref 150–400)
RBC: 3.86 MIL/uL — ABNORMAL LOW (ref 3.87–5.11)
RDW: 12.8 % (ref 11.5–15.5)
WBC: 6.3 10*3/uL (ref 4.0–10.5)
nRBC: 0 % (ref 0.0–0.2)

## 2019-10-27 LAB — HEPARIN LEVEL (UNFRACTIONATED): Heparin Unfractionated: 0.63 IU/mL (ref 0.30–0.70)

## 2019-10-27 MED ORDER — APIXABAN 5 MG PO TABS: 5.0000 mg | ORAL_TABLET | Freq: Two times a day (BID) | ORAL | Status: DC

## 2019-10-27 MED ORDER — APIXABAN 5 MG PO TABS
10.0000 mg | ORAL_TABLET | Freq: Two times a day (BID) | ORAL | Status: DC
  Administered 2019-10-27 – 2019-10-28 (×3): 10 mg via ORAL
  Filled 2019-10-27 (×3): qty 2

## 2019-10-27 NOTE — Progress Notes (Signed)
ANTICOAGULATION CONSULT NOTE - Initial Consult  Pharmacy Consult for Apixaban Indication: DVT  No Known Allergies  Patient Measurements: Height: 5\' 8"  (172.7 cm) Weight: 98.7 kg (217 lb 9.5 oz) IBW/kg (Calculated) : 63.9  Vital Signs: Temp: 98 F (36.7 C) (07/10 0920) Temp Source: Oral (07/10 0920) BP: 95/84 (07/10 0920) Pulse Rate: 97 (07/10 0920)  Labs: Recent Labs    10/25/19 1412 10/25/19 1412 10/26/19 0205 10/26/19 0205 10/26/19 1026 10/26/19 2228 10/27/19 0306  HGB 12.8   < > 12.1  --   --   --  11.2*  HCT 39.8  --  36.8  --   --   --  33.8*  PLT 139*  --  140*  --   --   --  128*  HEPARINUNFRC  --   --  0.90*   < > 0.88* 0.71* 0.63  CREATININE 0.97  --  0.84  --   --   --   --    < > = values in this interval not displayed.    Estimated Creatinine Clearance: 114.8 mL/min (by C-G formula based on SCr of 0.84 mg/dL).   Medical History: Past Medical History:  Diagnosis Date  . Asthma   . DVT (deep venous thrombosis) (HCC)   . Protein S deficiency (HCC)     Assessment: Day 2 heparin treatment with therapeutic heparin level. Hgb, hct trending down slightly  Goal of Therapy:  Monitor platelets by anticoagulation protocol: Yes   Plan:  Initiate apixaban 10mg  twice a day for 7 days then 5mg  twice daily  D/C heparin at first dose of apixaban  Monitor hbc, hct, plt while inpatient Counsel patient on apixaban, signs/sx of bleeding and clotting.   12/28/19, PharmD PGY1 Pharmacy Resident Phone: [phone] 10/27/2019 10:11 AM

## 2019-10-27 NOTE — Progress Notes (Signed)
    Subjective  - POD #1, status post mechanical thrombectomy and stenting for a left leg DVT  She does have some soreness this morning.  She has already walked.   Physical Exam:  Left leg is wrapped with Kerlix and Ace wrap.       Assessment/Plan:  POD #1  Left leg DVT, status post mechanical thrombectomy and stenting: She has been measured for compression socks which will hopefully be placed today.  She will need a stitch in her popliteal space removed prior to discharge.  This will likely be tomorrow.  I will stop her heparin and transition her to DOAC  Wells Kaitlyn Brown 10/27/2019 8:04 AM --  Vitals:   10/27/19 0300 10/27/19 0400  BP: (!) 97/57 97/67  Pulse: 88 83  Resp: 13 13  Temp: 98.2 F (36.8 C)   SpO2: 94% 95%    Intake/Output Summary (Last 24 hours) at 10/27/2019 0804 Last data filed at 10/27/2019 0339 Gross per 24 hour  Intake 1263.86 ml  Output --  Net 1263.86 ml     Laboratory CBC    Component Value Date/Time   WBC 6.3 10/27/2019 0306   HGB 11.2 (L) 10/27/2019 0306   HCT 33.8 (L) 10/27/2019 0306   PLT 128 (L) 10/27/2019 0306    BMET    Component Value Date/Time   NA 137 10/26/2019 0205   K 3.8 10/26/2019 0205   CL 105 10/26/2019 0205   CO2 25 10/26/2019 0205   GLUCOSE 130 (H) 10/26/2019 0205   BUN 7 10/26/2019 0205   CREATININE 0.84 10/26/2019 0205   CALCIUM 8.9 10/26/2019 0205   GFRNONAA >60 10/26/2019 0205   GFRAA >60 10/26/2019 0205    COAG No results found for: INR, PROTIME No results found for: PTT  Antibiotics Anti-infectives (From admission, onward)   None       V. Charlena Cross, M.D., [facility] [place] Office: [phone] Pager:  [phone]

## 2019-10-28 MED ORDER — OXYCODONE HCL 5 MG PO TABS: 5.0000 mg | ORAL_TABLET | Freq: Four times a day (QID) | ORAL | 0 refills | Status: DC | PRN

## 2019-10-28 MED ORDER — APIXABAN 5 MG PO TABS: 10.0000 mg | ORAL_TABLET | Freq: Two times a day (BID) | ORAL | 0 refills | Status: DC

## 2019-10-28 MED ORDER — APIXABAN 5 MG PO TABS: 5.0000 mg | ORAL_TABLET | Freq: Two times a day (BID) | ORAL | 3 refills | Status: DC

## 2019-10-28 NOTE — Care Management (Signed)
Patient given Eliquis cards.  RW ordered to be delivered to room prior to d/c.

## 2019-10-28 NOTE — Progress Notes (Signed)
    Subjective  - POD #2  Was able to walk yesterday. She feels the swelling in her leg has decreased.  Physical Exam:  Suture removal from left popliteal space. Less edema in the left leg. No blistering.       Assessment/Plan:  POD #2  Status post mechanical thrombectomy and stenting of her left leg for acute and chronic thrombus as well as a may Thurner syndrome. She is now ambulating and her pain is better controlled. She would like to go home. We will keep her on Eliquis indefinitely. She will follow-up with me in 1 month.  Wells Elzie Sheets 10/28/2019 8:14 AM --  Vitals:   10/28/19 0534 10/28/19 0606  BP: (!) 87/56 103/66  Pulse: 84 88  Resp: 14 12  Temp: 98.4 F (36.9 C)   SpO2: 96% 97%    Intake/Output Summary (Last 24 hours) at 10/28/2019 0814 Last data filed at 10/28/2019 7408 Gross per 24 hour  Intake 800 ml  Output --  Net 800 ml     Laboratory CBC    Component Value Date/Time   WBC 6.3 10/27/2019 0306   HGB 11.2 (L) 10/27/2019 0306   HCT 33.8 (L) 10/27/2019 0306   PLT 128 (L) 10/27/2019 0306    BMET    Component Value Date/Time   NA 137 10/26/2019 0205   K 3.8 10/26/2019 0205   CL 105 10/26/2019 0205   CO2 25 10/26/2019 0205   GLUCOSE 130 (H) 10/26/2019 0205   BUN 7 10/26/2019 0205   CREATININE 0.84 10/26/2019 0205   CALCIUM 8.9 10/26/2019 0205   GFRNONAA >60 10/26/2019 0205   GFRAA >60 10/26/2019 0205    COAG No results found for: INR, PROTIME No results found for: PTT  Antibiotics Anti-infectives (From admission, onward)   None       V. Charlena Cross, M.D., [facility] [place] Office: [phone] Pager:  [phone]

## 2019-10-28 NOTE — Progress Notes (Signed)
    To whom it may concern,  Please excuse [name], she has been hospitalized from 10/25/19 to 10/28/19 and will be able to return to work/school on 11/06/19.  Thanks,  Emilie Rutter, PA-C [facility] [phone] 10/28/2019  8:09 AM

## 2019-10-28 NOTE — Discharge Instructions (Signed)
[facility]  Discharge Instructions  Lower Extremity Angiogram; Angioplasty/Stenting  Please refer to the following instructions for your post-procedure care. Your surgeon or physician assistant will discuss any changes with you.  Activity  Avoid lifting more than 8 pounds (1 gallons of milk) for 72 hours (3 days) after your procedure. You may walk as much as you can tolerate. It's OK to drive after 72 hours.  Bathing/Showering  You may shower the day after your procedure. If you have a bandage, you may remove it at 24- 48 hours. Clean your incision site with mild soap and water. Pat the area dry with a clean towel.  Diet  Resume your pre-procedure diet. There are no special food restrictions following this procedure. All patients with peripheral vascular disease should follow a low fat/low cholesterol diet. In order to heal from your surgery, it is CRITICAL to get adequate nutrition. Your body requires vitamins, minerals, and protein. Vegetables are the best source of vitamins and minerals. Vegetables also provide the perfect balance of protein. Processed food has little nutritional value, so try to avoid this.  Medications  Resume taking all of your medications unless your doctor tells you not to. If your incision is causing pain, you may take over-the-counter pain relievers such as acetaminophen (Tylenol)  Follow Up  Follow up will be arranged at the time of your procedure. You may have an office visit scheduled or may be scheduled for surgery. Ask your surgeon if you have any questions.  Please call us immediately for any of the following conditions: Severe or worsening pain your legs or feet at rest or with walking. Increased pain, redness, drainage at your groin puncture site. Fever of 101 degrees or higher. If you have any mild or slow bleeding from your puncture site: lie down, apply firm constant pressure over the area with a piece of  gauze or a clean wash cloth for 30 minutes- no peeking!, call 911 right away if you are still bleeding after 30 minutes, or if the bleeding is heavy and unmanageable.  Reduce your risk factors of vascular disease:  Stop smoking. If you would like help call [facility] at 1-800-QUIT-NOW (6405254182) or [facility] at 775-761-3202. Manage your cholesterol Maintain a desired weight Control your diabetes Keep your blood pressure down  If you have any questions, please call the office at [phone]              Information on my medicine - ELIQUIS (apixaban)  Why was Eliquis prescribed for you? Eliquis was prescribed to treat blood clots that may have been found in the veins of your legs (deep vein thrombosis) or in your lungs (pulmonary embolism) and to reduce the risk of them occurring again.  What do You need to know about Eliquis ? The starting dose is 10 mg (two 5 mg tablets) taken TWICE daily for the FIRST SEVEN (7) DAYS, then on 11/03/19 the dose is reduced to ONE 5 mg tablet taken TWICE daily.  Eliquis may be taken with or without food.   Try to take the dose about the same time in the morning and in the evening. If you have difficulty swallowing the tablet whole please discuss with your pharmacist how to take the medication safely.  Take Eliquis exactly as prescribed and DO NOT stop taking Eliquis without talking to the doctor who prescribed the medication.  Stopping may increase your risk of developing a new blood clot.  Refill your prescription  before you run out.  After discharge, you should have regular check-up appointments with your healthcare provider that is prescribing your Eliquis.    What do you do if you miss a dose? If a dose of ELIQUIS is not taken at the scheduled time, take it as soon as possible on the same day and twice-daily administration should be resumed. The dose should not be doubled to make up for a missed dose.  Important Safety  Information A possible side effect of Eliquis is bleeding. You should call your healthcare provider right away if you experience any of the following: ? Bleeding from an injury or your nose that does not stop. ? Unusual colored urine (red or dark brown) or unusual colored stools (red or black). ? Unusual bruising for unknown reasons. ? A serious fall or if you hit your head (even if there is no bleeding).  Some medicines may interact with Eliquis and might increase your risk of bleeding or clotting while on Eliquis. To help avoid this, consult your healthcare provider or pharmacist prior to using any new prescription or non-prescription medications, including herbals, vitamins, non-steroidal anti-inflammatory drugs (NSAIDs) and supplements.  This website has more information on Eliquis (apixaban): http://www.eliquis.com/eliquis/home

## 2019-10-28 NOTE — Progress Notes (Signed)
Discharged to home with family office visits in place teaching done  

## 2019-10-29 ENCOUNTER — Encounter (HOSPITAL_COMMUNITY): Payer: Self-pay | Admitting: Surgery

## 2019-10-29 NOTE — Discharge Summary (Signed)
  Discharge Summary  Patient ID: Kaitlyn Brown [id number] 33 y.o. Apr 02, 1986  Admit date: 10/25/2019  Discharge date and time: 10/28/2019 12:02 PM   Admitting Physician: Maeola Harman, MD   Discharge Physician: Dr. Myra Gianotti  Admission Diagnoses: DVT (deep venous thrombosis) (HCC) [I82.409] Acute deep vein thrombosis (DVT) of proximal vein of left lower extremity (HCC) [I82.4Y2] Multiple subsegmental pulmonary emboli without acute cor pulmonale (HCC) [I26.94]  Discharge Diagnoses: same  Admission Condition: poor  Discharged Condition: fair  Indication for Admission: extensive DVT of LLE   Hospital Course: [name] is a 34 year old female who was admitted to the hospital with PE and extensive DVT of left lower extremity.  She was started on IV heparin.  She then underwent mechanical thrombectomy and balloon venoplasty of left common iliac, external iliac, common femoral, femoral, and popliteal vein by Dr. Myra Gianotti on 10/26/2019.  She tolerated the procedure well and was kept on IV heparin postoperatively.  POD #1 involved postoperative pain control and increasing mobility.  She was also fitted for thigh-high 20 to 30 mmHg thigh-high compression.  POD #2 she is ready for discharge home.  She was transitioned from IV heparin to Eliquis and provided a prescription.  She will follow-up in office with Dr. Myra Gianotti in about 1 month with IVC and iliac venous duplex.  She was discharged in stable condition.  Consults: None  Treatments: surgery: Mechanical thrombectomy and balloon venoplasty of left common iliac, external iliac, common femoral, femoral, and popliteal vein by Dr. Myra Gianotti on 10/26/2019.  Discharge Exam: See progress note 10/27/19 Vitals:   10/28/19 0822 10/28/19 1050  BP: 105/65 111/72  Pulse:    Resp:    Temp: 98 F (36.7 C) 98.2 F (36.8 C)  SpO2:       Disposition: Discharge disposition: 01-Home or Self Care       Patient Instructions:    Allergies as of 10/28/2019   No Known Allergies     Medication List    TAKE these medications   albuterol 108 (90 Base) MCG/ACT inhaler Commonly known as: VENTOLIN HFA Inhale 2 puffs into the lungs every 6 (six) hours as needed for wheezing or shortness of breath.   apixaban 5 MG Tabs tablet Commonly known as: ELIQUIS Take 2 tablets (10 mg total) by mouth 2 (two) times daily for 5 days.   apixaban 5 MG Tabs tablet Commonly known as: ELIQUIS Take 1 tablet (5 mg total) by mouth 2 (two) times daily. Start taking on: November 03, 2019   fexofenadine 60 MG tablet Commonly known as: ALLEGRA Take 60 mg by mouth at bedtime.   ibuprofen 600 MG tablet Commonly known as: ADVIL Take 1 tablet (600 mg total) by mouth every 6 (six) hours as needed. What changed: reasons to take this   methocarbamol 500 MG tablet Commonly known as: ROBAXIN Take 1 tablet (500 mg total) by mouth 2 (two) times daily.   montelukast 10 MG tablet Commonly known as: Singulair Take 1 tablet (10 mg total) by mouth at bedtime.   oxyCODONE 5 MG immediate release tablet Commonly known as: Oxy IR/ROXICODONE Take 1 tablet (5 mg total) by mouth every 6 (six) hours as needed for moderate pain.      Activity: activity as tolerated Diet: regular diet Wound Care: keep wound clean and dry  Follow-up with Dr. Myra Gianotti in 4 weeks.  Signed: Emilie Rutter, PA-C 10/29/2019 10:57 AM [facility] Office: [phone]

## 2019-11-09 ENCOUNTER — Other Ambulatory Visit: Payer: Self-pay

## 2019-11-09 DIAGNOSIS — I824Y9 Acute embolism and thrombosis of unspecified deep veins of unspecified proximal lower extremity: Secondary | ICD-10-CM

## 2019-11-23 ENCOUNTER — Ambulatory Visit (INDEPENDENT_AMBULATORY_CARE_PROVIDER_SITE_OTHER)
Admission: RE | Admit: 2019-11-23 | Discharge: 2019-11-23 | Disposition: A | Discharge status: Home / Self Care | Payer: BC Managed Care – PPO | Source: Ambulatory Visit | Attending: Surgery | Admitting: Surgery

## 2019-11-23 ENCOUNTER — Other Ambulatory Visit: Payer: Self-pay

## 2019-11-23 ENCOUNTER — Ambulatory Visit (HOSPITAL_COMMUNITY)
Admission: RE | Admit: 2019-11-23 | Discharge: 2019-11-23 | Disposition: A | Discharge status: Home / Self Care | Payer: BC Managed Care – PPO | Source: Ambulatory Visit | Attending: Surgery | Admitting: Surgery

## 2019-11-23 DIAGNOSIS — I824Y9 Acute embolism and thrombosis of unspecified deep veins of unspecified proximal lower extremity: Secondary | ICD-10-CM

## 2019-11-26 ENCOUNTER — Other Ambulatory Visit: Payer: Self-pay

## 2019-11-26 ENCOUNTER — Ambulatory Visit (INDEPENDENT_AMBULATORY_CARE_PROVIDER_SITE_OTHER): Payer: BC Managed Care – PPO | Admitting: Surgery

## 2019-11-26 ENCOUNTER — Encounter: Payer: Self-pay | Admitting: Surgery

## 2019-11-26 VITALS — BP 114/80 | HR 95 | Resp 20 | Ht 68.0 in | Wt 240.0 lb

## 2019-11-26 DIAGNOSIS — I824Y9 Acute embolism and thrombosis of unspecified deep veins of unspecified proximal lower extremity: Secondary | ICD-10-CM | POA: Diagnosis not present

## 2019-11-26 NOTE — Progress Notes (Signed)
[facility] [place]  Patient name: Kaitlyn Brown MRN: [id number] DOB: 07-01-1985 Sex: female   REASON FOR VISIT:    Follow-up  HISOTRY OF PRESENT ILLNESS:    Kaitlyn Brown is a 34 y.o. 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  She has a known protein S deficiency.  She has been treated with aspirin after completing anticoagulation regimens.  She presented to the emergency department on 10/25/2019 with leg swelling.  She was found to have a DVT.  She went to the Cath Lab for intervention on 10/26/2019.  She had thrombus visualized in her inferior vena cava which had both acute and chronic components to it.  It went up to the renal veins.  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  She underwent mechanical thrombectomy which evacuated the majority of clot within the inferior vena cava.  She then had stenting of her may Thurner and balloon angioplasty of the common femoral, femoral and popliteal veins.  She is back today for follow-up.  She states that her leg swelling is much better.  She continues to wear her compression socks.   PAST MEDICAL HISTORY:   Past Medical History:  Diagnosis Date  . Asthma   . DVT (deep venous thrombosis) (HCC)   . Protein S deficiency (HCC)      FAMILY HISTORY:   History reviewed. No pertinent family history.  SOCIAL HISTORY:   Social History   Tobacco Use  . Smoking status: Never Smoker  . Smokeless tobacco: Never Used  Substance Use Topics  . Alcohol use: Not Currently     ALLERGIES:   No Known Allergies   CURRENT MEDICATIONS:   Current Outpatient Medications  Medication Sig Dispense Refill  . albuterol (VENTOLIN HFA) 108 (90 Base) MCG/ACT inhaler Inhale 2 puffs into the lungs every 6 (six) hours as needed for wheezing or  shortness of breath.    Marland Kitchen apixaban (ELIQUIS) 5 MG TABS tablet Take 1 tablet (5 mg total) by mouth 2 (two) times daily. 60 tablet 3  . fexofenadine (ALLEGRA) 60 MG tablet Take 60 mg by mouth at bedtime.    Marland Kitchen ibuprofen (ADVIL) 600 MG tablet Take 1 tablet (600 mg total) by mouth every 6 (six) hours as needed. (Patient taking differently: Take 600 mg by mouth every 6 (six) hours as needed for headache, moderate pain or cramping. ) 30 tablet 0  . methocarbamol (ROBAXIN) 500 MG tablet Take 1 tablet (500 mg total) by mouth 2 (two) times daily. 20 tablet 0  . montelukast (SINGULAIR) 10 MG tablet Take 1 tablet (10 mg total) by mouth at bedtime. 30 tablet 0   No current facility-administered medications for this visit.    REVIEW OF SYSTEMS:   [X]  denotes positive finding, [ ]  denotes negative finding Cardiac  Comments:  Chest pain or chest pressure: x   Shortness of breath upon exertion: x   Short of breath when lying flat: x   Irregular heart rhythm:        Vascular    Pain in calf, thigh, or hip brought on by ambulation: x   Pain in feet at night that wakes you up from your sleep:     Blood clot in your veins: x   Leg swelling:  x       Pulmonary  Oxygen at home:    Productive cough:     Wheezing:         Neurologic    Sudden weakness in arms or legs:     Sudden numbness in arms or legs:     Sudden onset of difficulty speaking or slurred speech:    Temporary loss of vision in one eye:     Problems with dizziness:         Gastrointestinal    Blood in stool:     Vomited blood:         Genitourinary    Burning when urinating:     Blood in urine:        Psychiatric    Major depression:         Hematologic    Bleeding problems:    Problems with blood clotting too easily:        Skin    Rashes or ulcers:        Constitutional    Fever or chills:      PHYSICAL EXAM:   Vitals:   11/26/19 0847  BP: 114/80  Pulse: 95  Resp: 20  SpO2: 97%  Weight: 240 lb (108.9 kg)    Height: 5\' 8"  (1.727 m)    GENERAL: The patient is a well-nourished female, in no acute distress. The vital signs are documented above. CARDIAC: There is a regular rate and rhythm.  VASCULAR: Left leg edema PULMONARY: Non-labored respirations MUSCULOSKELETAL: There are no major deformities or cyanosis. NEUROLOGIC: No focal weakness or paresthesias are detected. SKIN: There are no ulcers or rashes noted. PSYCHIATRIC: The patient has a normal affect.  STUDIES:   I have reviewed the following vascular lab studies: Ileocaval: IVC/Iliac: There is no evidence of thrombus involving the IVC. There is no  evidence of thrombus involving the left common iliac vein. There is  evidence of age indeterminate thrombus involving the left external iliac  vein.   Lower extremity: - Left :Findings consistent with age indeterminate deep vein thrombosis  involving the left common femoral vein, SF junction, left femoral vein,  left proximal profunda vein, and left popliteal vein.   MEDICAL ISSUES:   Protein S deficiency with multiple left-sided DVT, status post mechanical thrombectomy and iliac vein stenting.  She is doing much better.  Unfortunately her duplex shows that her stent has occluded.  I discussed these findings with the patient.  Given that she has relatively minimal symptoms despite her stent occlusion, I would not recommend reintervention at this time.  She will continue her anticoagulation and contact me if she has any changes in her symptoms.    , MD, FACS [facility] [phone] Pager [phone]

## 2019-12-29 ENCOUNTER — Emergency Department (HOSPITAL_COMMUNITY): Payer: BC Managed Care – PPO

## 2019-12-29 ENCOUNTER — Emergency Department (HOSPITAL_COMMUNITY)
Admission: EM | Admit: 2019-12-29 | Discharge: 2019-12-30 | Disposition: A | Discharge status: Home / Self Care | Payer: BC Managed Care – PPO | Attending: Emergency Medicine | Admitting: Emergency Medicine

## 2019-12-29 ENCOUNTER — Encounter (HOSPITAL_COMMUNITY): Payer: Self-pay

## 2019-12-29 DIAGNOSIS — Z5321 Procedure and treatment not carried out due to patient leaving prior to being seen by health care provider: Secondary | ICD-10-CM | POA: Insufficient documentation

## 2019-12-29 DIAGNOSIS — R519 Headache, unspecified: Secondary | ICD-10-CM | POA: Insufficient documentation

## 2019-12-29 DIAGNOSIS — Z7901 Long term (current) use of anticoagulants: Secondary | ICD-10-CM | POA: Insufficient documentation

## 2019-12-29 LAB — COMPREHENSIVE METABOLIC PANEL
ALT: 18 U/L (ref 0–44)
AST: 23 U/L (ref 15–41)
Albumin: 3.7 g/dL (ref 3.5–5.0)
Alkaline Phosphatase: 62 U/L (ref 38–126)
Anion gap: 9 (ref 5–15)
BUN: 10 mg/dL (ref 6–20)
CO2: 22 mmol/L (ref 22–32)
Calcium: 8.8 mg/dL — ABNORMAL LOW (ref 8.9–10.3)
Chloride: 104 mmol/L (ref 98–111)
Creatinine, Ser: 0.82 mg/dL (ref 0.44–1.00)
GFR calc Af Amer: >60 mL/min (ref >60)
GFR calc non Af Amer: >60 mL/min (ref >60)
Glucose, Bld: 108 mg/dL — ABNORMAL HIGH (ref 70–99)
Potassium: 4 mmol/L (ref 3.5–5.1)
Sodium: 135 mmol/L (ref 135–145)
Total Bilirubin: 0.6 mg/dL (ref 0.3–1.2)
Total Protein: 7.1 g/dL (ref 6.5–8.1)

## 2019-12-29 LAB — CBC
HCT: 39.7 % (ref 36.0–46.0)
Hemoglobin: 12.9 g/dL (ref 12.0–15.0)
MCH: 27.4 pg (ref 26.0–34.0)
MCHC: 32.5 g/dL (ref 30.0–36.0)
MCV: 84.5 fL (ref 80.0–100.0)
Platelets: 203 10*3/uL (ref 150–400)
RBC: 4.7 MIL/uL (ref 3.87–5.11)
RDW: 13.7 % (ref 11.5–15.5)
WBC: 3.3 10*3/uL — ABNORMAL LOW (ref 4.0–10.5)
nRBC: 0 % (ref 0.0–0.2)

## 2019-12-29 LAB — DIFFERENTIAL
Abs Immature Granulocytes: 0.01 10*3/uL (ref 0.00–0.07)
Basophils Absolute: 0 10*3/uL (ref 0.0–0.1)
Basophils Relative: 1 %
Eosinophils Absolute: 0.1 10*3/uL (ref 0.0–0.5)
Eosinophils Relative: 2 %
Immature Granulocytes: 0 %
Lymphocytes Relative: 38 %
Lymphs Abs: 1.3 10*3/uL (ref 0.7–4.0)
Monocytes Absolute: 0.3 10*3/uL (ref 0.1–1.0)
Monocytes Relative: 10 %
Neutro Abs: 1.6 10*3/uL — ABNORMAL LOW (ref 1.7–7.7)
Neutrophils Relative %: 49 %

## 2019-12-29 LAB — I-STAT CHEM 8, ED
BUN: 9 mg/dL (ref 6–20)
Calcium, Ion: 1.15 mmol/L (ref 1.15–1.40)
Chloride: 104 mmol/L (ref 98–111)
Creatinine, Ser: 0.8 mg/dL (ref 0.44–1.00)
Glucose, Bld: 105 mg/dL — ABNORMAL HIGH (ref 70–99)
HCT: 39 % (ref 36.0–46.0)
Hemoglobin: 13.3 g/dL (ref 12.0–15.0)
Potassium: 4 mmol/L (ref 3.5–5.1)
Sodium: 138 mmol/L (ref 135–145)
TCO2: 22 mmol/L (ref 22–32)

## 2019-12-29 LAB — PROTIME-INR
INR: 1 (ref 0.8–1.2)
Prothrombin Time: 12.8 seconds (ref 11.4–15.2)

## 2019-12-29 LAB — I-STAT BETA HCG BLOOD, ED (MC, WL, AP ONLY): I-stat hCG, quantitative: <5 m[IU]/mL (ref <5)

## 2019-12-29 LAB — APTT: aPTT: 28 seconds (ref 24–36)

## 2019-12-29 MED ORDER — SODIUM CHLORIDE 0.9% FLUSH: 3.0000 mL | Freq: Once | INTRAVENOUS | Status: DC

## 2019-12-29 NOTE — ED Triage Notes (Signed)
Pt reports that she has been having a headache for the past 4 days, on Eliquis for DVT and PE  , neuro intact bilaterally, reports worse headache ever.

## 2019-12-30 NOTE — ED Notes (Signed)
Patient states CT ordered by primary care physician has been completed. Pt left in stable condition.

## 2020-11-05 IMAGING — CT CT HEAD W/O CM
4 series · 15 of 47 positions shown, 17 images · non-contrast
Comparison: None.

CLINICAL DATA: Headache for 4 days. Patient is on Eliquis for DVT
and pulmonary embolus.

EXAM:
CT HEAD WITHOUT CONTRAST
TECHNIQUE: Contiguous axial images were obtained from the base of the skull
through the vertex without intravenous contrast.

[Series 3: head without · axial · non-contrast · 0.45mm/px · z∈[-147,-27]mm · 7 of 32 slices shown, 9 images]
[im 4/32  brain]
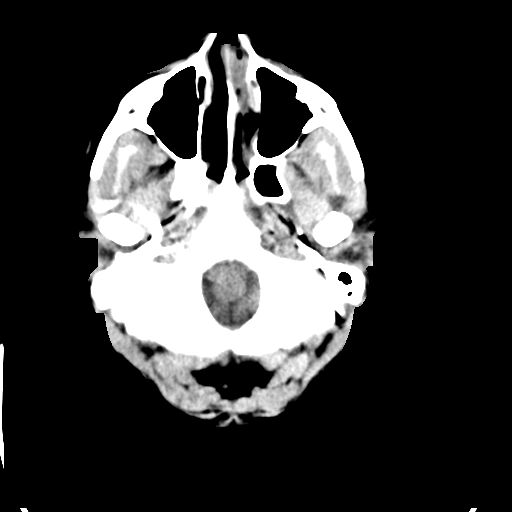
[im 4/32  bone]
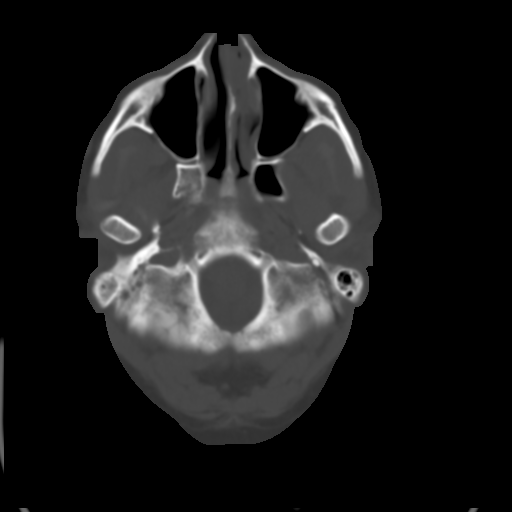
[im 8/32  brain]
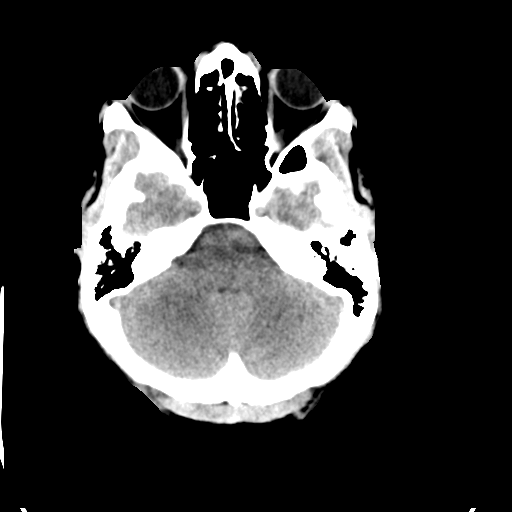
[im 12/32  brain]
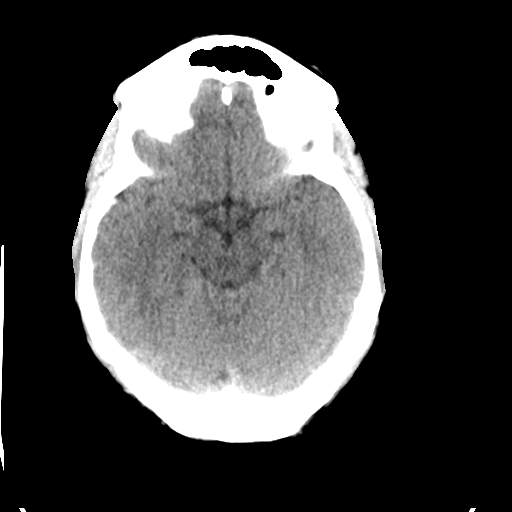
[im 16/32  brain]
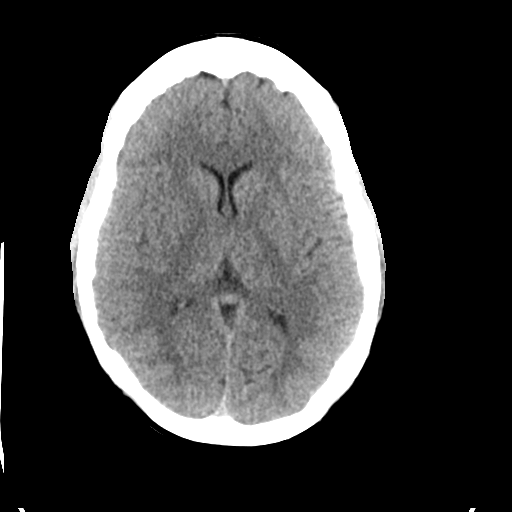
[im 20/32  brain]
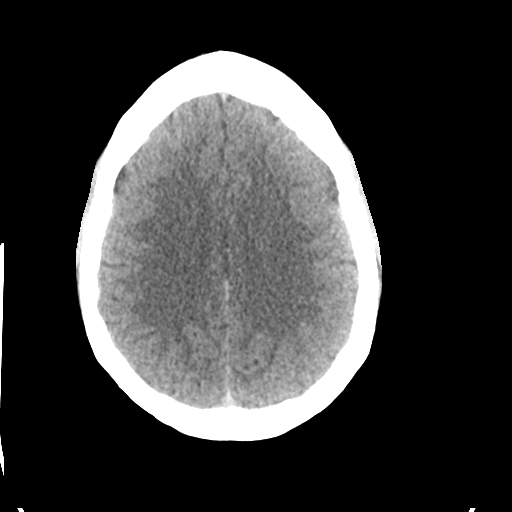
[im 20/32  bone]
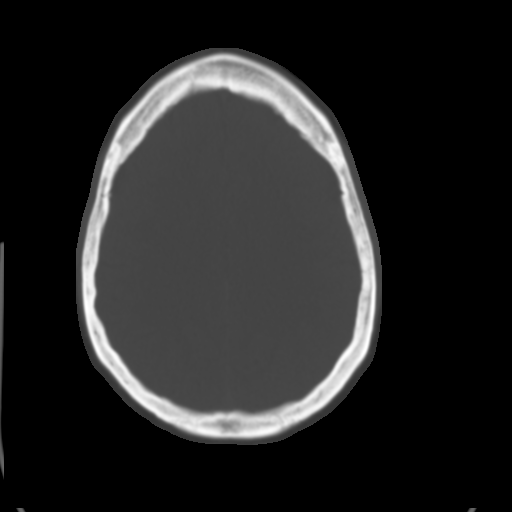
[im 24/32  brain]
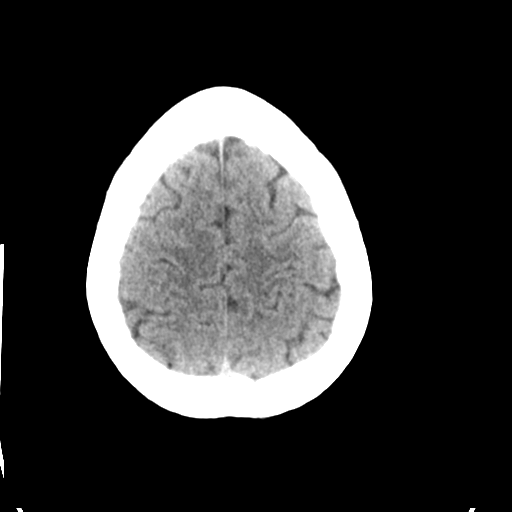
[im 28/32  brain]
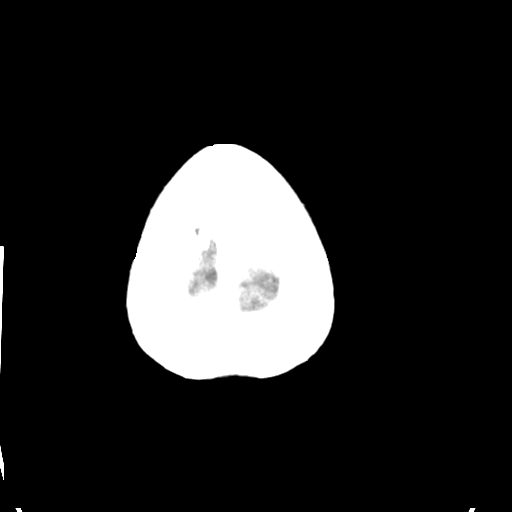

[Series 4: head bone · axial · 0.45mm/px · z∈[-148,-132]mm · 2 of 80 slices shown]
[im 8/80  bone]
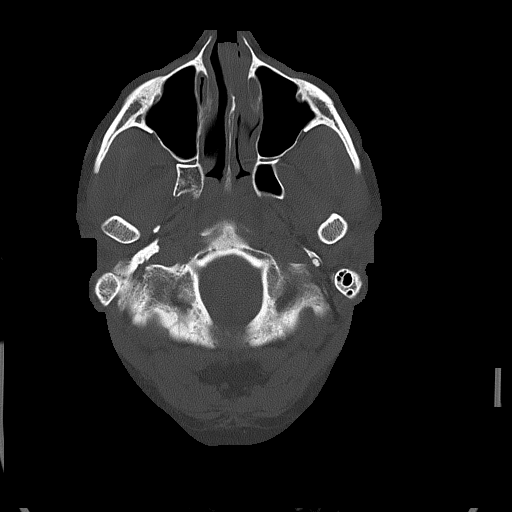
[im 16/80  bone]
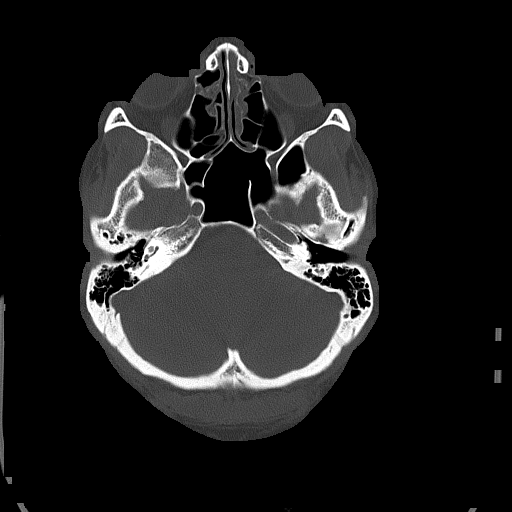

[Series 5: head without cor · coronal · non-contrast · 0.39mm/px · 3 of 74 slices shown]
[im 25/74  brain]
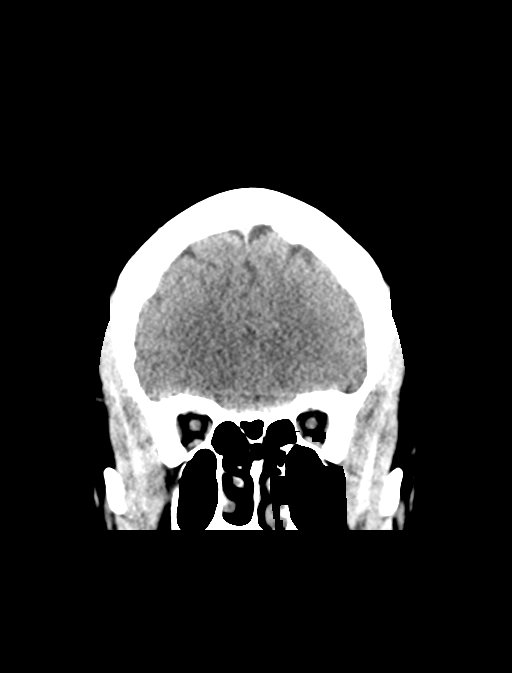
[im 33/74  brain]
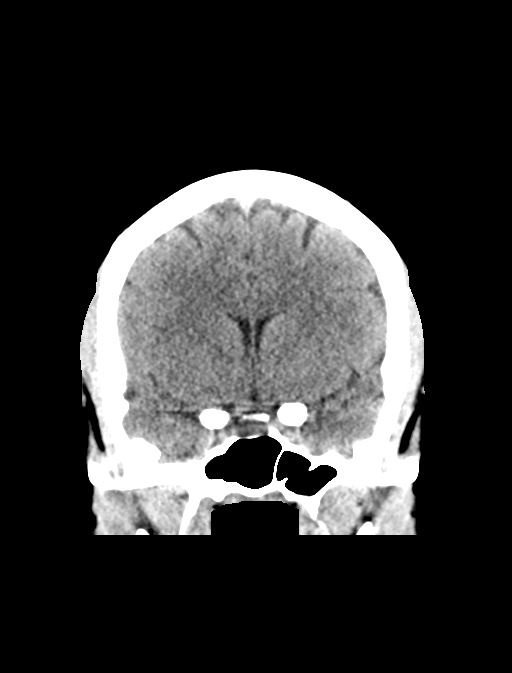
[im 41/74  brain]
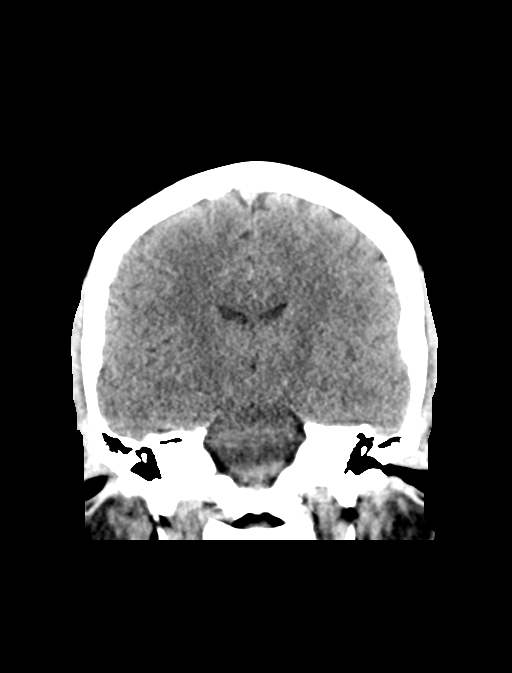

[Series 6: head without sag · sagittal · non-contrast · 0.31mm/px · 3 of 67 slices shown]
[im 23/67  brain]
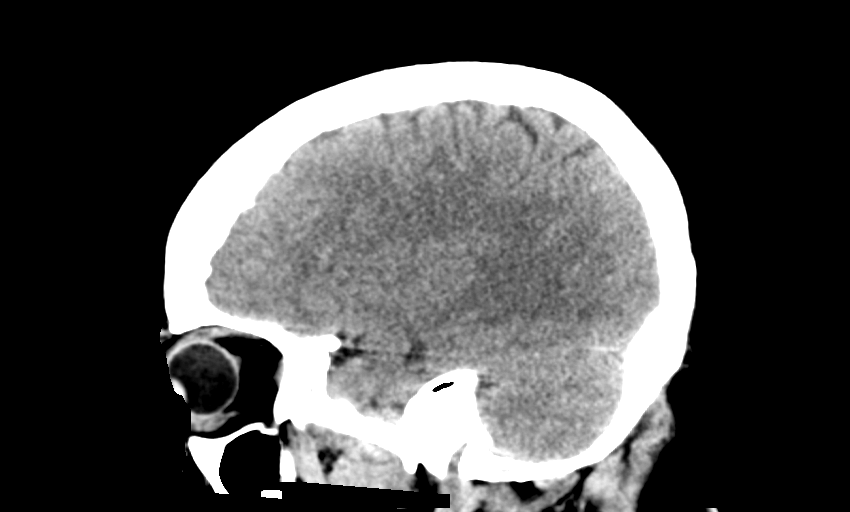
[im 34/67  brain]
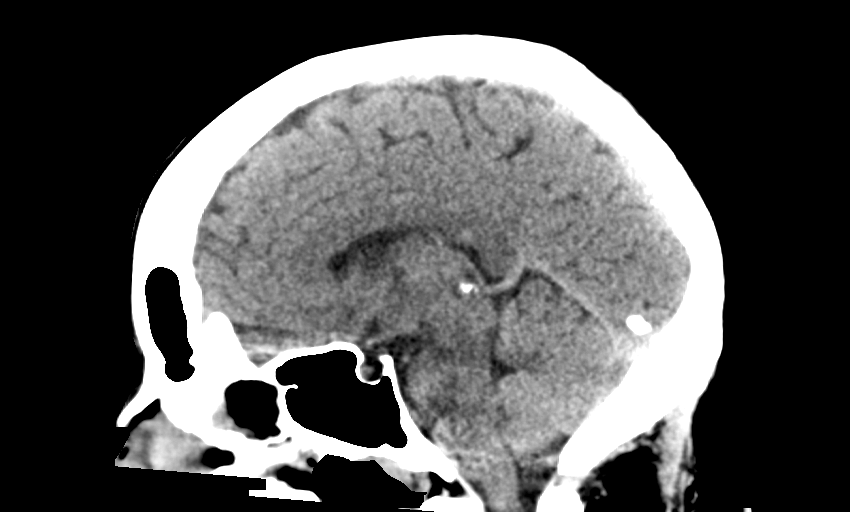
[im 45/67  brain]
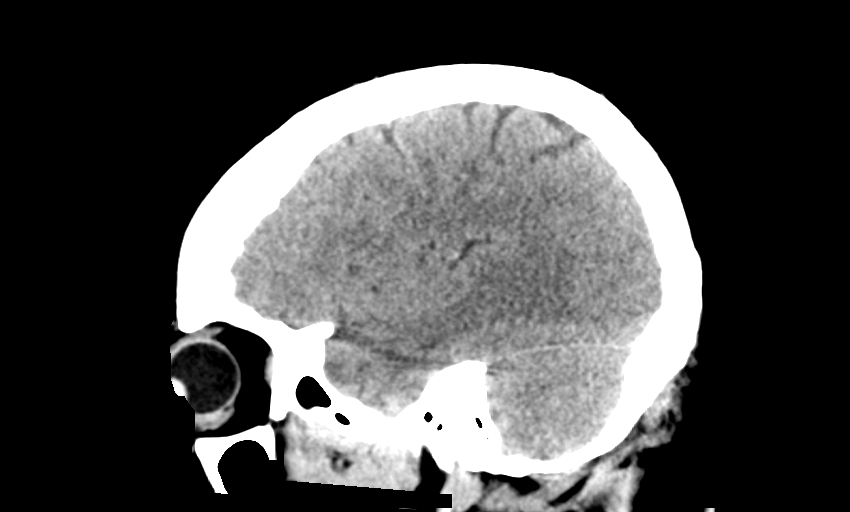

[15 of 47 positions shown; findings below may reference images not displayed]

FINDINGS: Brain: No evidence of acute infarction, hemorrhage, hydrocephalus,
extra-axial collection or mass lesion/mass effect.

Vascular: No hyperdense vessel or unexpected calcification.

Skull: Calvarium appears intact.

Sinuses/Orbits: Mucosal thickening in the paranasal sinuses. Mucosal
retention cyst in the right maxillary antrum. No acute air-fluid
levels. Mastoid air cells are clear.

Other: None.
IMPRESSION: 1. No acute intracranial abnormalities.
2. Chronic inflammatory changes in the paranasal sinuses.

## 2022-03-22 ENCOUNTER — Other Ambulatory Visit: Payer: Self-pay | Admitting: *Deleted

## 2022-03-23 ENCOUNTER — Other Ambulatory Visit: Payer: Self-pay | Admitting: *Deleted

## 2022-03-23 DIAGNOSIS — R609 Edema, unspecified: Secondary | ICD-10-CM

## 2022-03-29 ENCOUNTER — Ambulatory Visit (HOSPITAL_COMMUNITY)
Admission: RE | Admit: 2022-03-29 | Discharge: 2022-03-29 | Disposition: A | Discharge status: Home / Self Care | Payer: BC Managed Care – PPO | Source: Ambulatory Visit | Attending: Surgery | Admitting: Surgery

## 2022-03-29 ENCOUNTER — Encounter: Payer: Self-pay | Admitting: Surgery

## 2022-03-29 ENCOUNTER — Ambulatory Visit: Payer: BC Managed Care – PPO | Admitting: Surgery

## 2022-03-29 VITALS — BP 136/83 | HR 69 | Temp 98.2°F | Resp 20 | Ht 68.0 in | Wt 242.0 lb

## 2022-03-29 DIAGNOSIS — R609 Edema, unspecified: Secondary | ICD-10-CM | POA: Diagnosis not present

## 2022-03-29 DIAGNOSIS — I825Y2 Chronic embolism and thrombosis of unspecified deep veins of left proximal lower extremity: Secondary | ICD-10-CM | POA: Diagnosis not present

## 2022-03-29 NOTE — Progress Notes (Signed)
Vascular and Vein Specialist of [place]  Patient name: Kaitlyn Brown MRN: [id number] DOB: 1985/06/14 Sex: female   REASON FOR VISIT:    Follow up  HISOTRY OF PRESENT ILLNESS:   Kaitlyn Brown is a 36 y.o. 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  She has a known protein S deficiency.  She has been treated with aspirin after completing anticoagulation regimens.  She presented to the emergency department on 10/25/2019 with leg swelling.  She was found to have a DVT.  She went to the Cath Lab for intervention on 10/26/2019.  She had thrombus visualized in her inferior vena cava which had both acute and chronic components to it.  It went up to the renal veins.  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  She underwent mechanical thrombectomy which evacuated the majority of clot within the inferior vena cava.  She then had stenting of her may Thurner and balloon angioplasty of the common femoral, femoral and popliteal veins.  Unfortunately, at her 1 month follow-up, her stent was found to be occluded.  She was however experiencing minimal symptoms.  Therefore no intervention was recommended.  She still has some mild leg swelling which is manageable.  She wears her compression socks.  She now notes that she will get a bulge in her right groin that comes and goes sporadically.  She was sent for a CT scan which showed this to be a cluster of veins and so she is back today for follow-up.  She continues to take her anticoagulation for her protein C deficiency.    PAST MEDICAL HISTORY:   Past Medical History:  Diagnosis Date   Asthma    DVT (deep venous thrombosis) (HCC)    Protein S deficiency (HCC)      FAMILY HISTORY:   History reviewed. No pertinent family history.  SOCIAL HISTORY:   Social History    Tobacco Use   Smoking status: Never   Smokeless tobacco: Never  Substance Use Topics   Alcohol use: Not Currently     ALLERGIES:   Allergies  Allergen Reactions   Latex Anaphylaxis     CURRENT MEDICATIONS:   Current Outpatient Medications  Medication Sig Dispense Refill   albuterol (VENTOLIN HFA) 108 (90 Base) MCG/ACT inhaler Inhale 2 puffs into the lungs every 6 (six) hours as needed for wheezing or shortness of breath.     fexofenadine (ALLEGRA) 60 MG tablet Take 60 mg by mouth at bedtime.     ibuprofen (ADVIL) 600 MG tablet Take 1 tablet (600 mg total) by mouth every 6 (six) hours as needed. (Patient taking differently: Take 600 mg by mouth every 6 (six) hours as needed for headache, moderate pain or cramping.) 30 tablet 0   methocarbamol (ROBAXIN) 500 MG tablet Take 1 tablet (500 mg total) by mouth 2 (two) times daily. 20 tablet 0   XARELTO 10 MG TABS tablet Take 10 mg by mouth daily.     montelukast (SINGULAIR) 10 MG tablet Take 1 tablet (10 mg total) by mouth at bedtime. 30 tablet 0   No current facility-administered medications for this visit.    REVIEW OF SYSTEMS:   [X]  denotes positive finding, [ ]  denotes negative finding Cardiac  Comments:  Chest pain or chest pressure: x   Shortness of breath upon exertion:    Short of  breath when lying flat:    Irregular heart rhythm:        Vascular    Pain in calf, thigh, or hip brought on by ambulation:    Pain in feet at night that wakes you up from your sleep:     Blood clot in your veins:    Leg swelling:         Pulmonary    Oxygen at home:    Productive cough:     Wheezing:         Neurologic    Sudden weakness in arms or legs:     Sudden numbness in arms or legs:     Sudden onset of difficulty speaking or slurred speech:    Temporary loss of vision in one eye:     Problems with dizziness:         Gastrointestinal    Blood in stool:     Vomited blood:         Genitourinary    Burning when  urinating:     Blood in urine:        Psychiatric    Major depression:         Hematologic    Bleeding problems:    Problems with blood clotting too easily:        Skin    Rashes or ulcers:        Constitutional    Fever or chills:      PHYSICAL EXAM:   Vitals:   03/29/22 0834  BP: 136/83  Pulse: 69  Resp: 20  Temp: 98.2 F (36.8 C)  SpO2: 98%  Weight: 242 lb (109.8 kg)  Height: 5\' 8"  (1.727 m)    GENERAL: The patient is a well-nourished female, in no acute distress. The vital signs are documented above. CARDIAC: There is a regular rate and rhythm.  VASCULAR: 1+ pitting edema bilaterally, left greater than right PULMONARY: Non-labored respirations MUSCULOSKELETAL: There are no major deformities or cyanosis. NEUROLOGIC: No focal weakness or paresthesias are detected. SKIN: There are no ulcers or rashes noted. PSYCHIATRIC: The patient has a normal affect.  STUDIES:   I have reviewed her ultrasound with the following findings:  IVC/Iliac: No color or spectral Doppler flow observed within the left  common iliac vein stent of proximal external iliac vein stent.  Area of collaterals visualized in the mid anterior lower abdomen without  connection to iliac vein observed.  MEDICAL ISSUES:   Protein S deficiency: The patient has had multiple DVTs/PEs.  2 years ago she underwent mechanical thrombectomy with stenting for a May Thurner syndrome.  Unfortunately she had early occlusion of her stent.  This was not intervened upon because her symptoms were minimal.  She continues to have minimal symptoms in her legs which are able to be managed with compression socks.  Her biggest issue now is this mass in her right groin area that was found to be a cluster of veins on CT scan.  Unfortunately, [facility] and so I do not have access to her images.  I am requesting that we get a copy of her images so that I can better review these.  Our ultrasound today did not  show any communication between this cluster of veins and the left leg.  Further recommendations will be based upon my review of her CT scan.  I will schedule her for a virtual visit to go over the results.  Leia Alf, MD, FACS [facility] [phone] Pager [phone]

## 2022-04-26 ENCOUNTER — Encounter: Payer: Self-pay | Admitting: Surgery

## 2022-04-26 ENCOUNTER — Ambulatory Visit (INDEPENDENT_AMBULATORY_CARE_PROVIDER_SITE_OTHER): Payer: BC Managed Care – PPO | Admitting: Surgery

## 2022-04-26 DIAGNOSIS — I825Y2 Chronic embolism and thrombosis of unspecified deep veins of left proximal lower extremity: Secondary | ICD-10-CM | POA: Diagnosis not present

## 2022-04-26 NOTE — Progress Notes (Signed)
Vascular and Vein Specialist of [place]  Patient name: Kaitlyn Brown MRN: [id number] DOB: [birth year] Sex: female      Virtual Visit via Telephone Note   Because of Kaitlyn Brown's co-morbid illnesses, she is at least at moderate risk for complications without adequate follow up.  This format is felt to be most appropriate for this patient at this time.  The patient did not have access to video technology/had technical difficulties with video requiring transitioning to audio format only (telephone).  All issues noted in this document were discussed and addressed.  No physical exam could be performed with this format.    Patient Location: Home Provider Location: Office/Clinic    REASON FOR APPOINTMENT:    Follow-up  HISTORY OF PRESENT ILLNESS:   Kaitlyn Brown is a 37 y.o. 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  She has a known protein S deficiency.  She has been treated with aspirin after completing anticoagulation regimens.  She presented to the emergency department on 10/25/2019 with leg swelling.  She was found to have a DVT.  She went to the Cath Lab for intervention on 10/26/2019.  She had thrombus visualized in her inferior vena cava which had both acute and chronic components to it.  It went up to the renal veins.  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  She underwent mechanical thrombectomy which evacuated the majority of clot within the inferior vena cava.  She then had stenting of her may Thurner and balloon angioplasty of the common femoral, femoral and popliteal veins.  Unfortunately, at her 1 month follow-up, her stent was found to be occluded.  She was however experiencing minimal symptoms.  Therefore no intervention was recommended.   She still has mild leg swelling which is manageable with compression  socks.  She has recently noticed a bulge in her right groin that comes and goes sporadically.  She had a CT scan performed at [facility] which showed this to be a cluster of veins.  He did not have the images.  She brought me a CD to review her CAT scan.  She states that the vein is not as prominent because she has not been drinking as much water.  It does not cause her any significant discomfort    PAST MEDICAL HISTORY    Past Medical History:  Diagnosis Date   Asthma    DVT (deep venous thrombosis) (HCC)    Protein S deficiency (HCC)      FAMILY HISTORY   No family history on file.  SOCIAL HISTORY:   Social History   Socioeconomic History   Marital status: Single    Spouse name: Not on file   Number of children: Not on file   Years of education: Not on file   Highest education level: Not on file  Occupational History   Not on file  Tobacco Use   Smoking status: Never   Smokeless tobacco: Never  Vaping Use   Vaping Use: Never used  Substance and Sexual Activity   Alcohol use: Not Currently   Drug use: Never   Sexual activity: Not on file  Other Topics Concern   Not on file  Social History Narrative   Not on file   Social Determinants of Health  Financial Resource Strain: Not on file  Food Insecurity: Not on file  Transportation Needs: Not on file  Physical Activity: Not on file  Stress: Not on file  Social Connections: Not on file  Intimate Partner Violence: Not on file    ALLERGIES:    Allergies  Allergen Reactions   Latex Anaphylaxis    CURRENT MEDICATIONS:    Current Outpatient Medications  Medication Sig Dispense Refill   albuterol (VENTOLIN HFA) 108 (90 Base) MCG/ACT inhaler Inhale 2 puffs into the lungs every 6 (six) hours as needed for wheezing or shortness of breath.     fexofenadine (ALLEGRA) 60 MG tablet Take 60 mg by mouth at bedtime.     ibuprofen (ADVIL) 600 MG tablet Take 1 tablet (600 mg total) by mouth every 6 (six) hours as needed.  (Patient taking differently: Take 600 mg by mouth every 6 (six) hours as needed for headache, moderate pain or cramping.) 30 tablet 0   methocarbamol (ROBAXIN) 500 MG tablet Take 1 tablet (500 mg total) by mouth 2 (two) times daily. 20 tablet 0   montelukast (SINGULAIR) 10 MG tablet Take 1 tablet (10 mg total) by mouth at bedtime. 30 tablet 0   XARELTO 10 MG TABS tablet Take 10 mg by mouth daily.     No current facility-administered medications for this visit.    REVIEW OF SYSTEMS:   Please see the history of present illness.     All other systems reviewed and are negative.  PHYSICAL EXAM:     Recent Labs: No results found for requested labs within last 365 days.   Recent Lipid Panel No results found for: "CHOL", "TRIG", "HDL", "CHOLHDL", "LDLCALC", "LDLDIRECT"  Wt Readings from Last 3 Encounters:  03/29/22 242 lb (109.8 kg)  11/26/19 240 lb (108.9 kg)  10/25/19 217 lb 9.5 oz (98.7 kg)     STUDIES:   I reviewed her CT scan which shows a varicosity originating in the left groin coursing across the midline  ASSESSMENT and PLAN   The area of concern is a varicose vein.  I propose 3 treatment options.  The first would be continued observation.  The second would be microphlebectomy.  The third would be coil embolization or possibly injection.  She is not interested and addressing this at this time.  If her symptoms change, she will contact me.    Time:   Today, I have spent 11 minutes with the patient with telehealth technology discussing the above problems.        Leia Alf, MD, FACS [facility] [phone] Pager [phone]

## 2022-08-04 ENCOUNTER — Encounter (INDEPENDENT_AMBULATORY_CARE_PROVIDER_SITE_OTHER): Payer: Self-pay | Admitting: Student in an Organized Health Care Education/Training Program

## 2022-08-04 ENCOUNTER — Ambulatory Visit (INDEPENDENT_AMBULATORY_CARE_PROVIDER_SITE_OTHER): Payer: Medicaid Other | Admitting: Student in an Organized Health Care Education/Training Program

## 2022-08-04 VITALS — BP 126/83 | HR 77 | Temp 97.8°F | Ht 60.0 in | Wt 136.0 lb

## 2022-08-04 DIAGNOSIS — E039 Hypothyroidism, unspecified: Secondary | ICD-10-CM

## 2022-08-04 DIAGNOSIS — Z0000 Encounter for general adult medical examination without abnormal findings: Secondary | ICD-10-CM

## 2022-08-04 NOTE — Progress Notes (Signed)
[place] [facility] VISIT           Margaret Ellis  is a 37 y.o.  female.  HPI    37 year old with pmhx of hypothyroidism presents to establish care and annual physical.     Diagnosed 2014. Been on for years.     #Annual Physical  Last CPE:     Soc Hx:   - Diet: well balanced  - Exercise: walking everyday trying to lose weight. 143 in January now 136  - Supplements: none  - Alcohol: none  - Tobacco: none  - Drugs: none  - Occupation: full time works at school  - Functional Status: high  - Proofreader  - Mood: good  - Feels safe at home: yes    #OB-GYN Hx  - Menstrual Cycle: regular   - Sexually Active: none currently  - Contraception: none currently    Preventative  - PAP Smear: 2022 due again in 2027  - Mammogram   - Colonoscopy  - PSA  - Lung Cancer Screening  - Vision  - Dental  - [employer]   - Advance Directive    #Labs  - Lipids  - A1C  - HIV: neg 2022  - Hep C: neg 2022    #Immunization  - Flu  - COVID  - Prevnar 20  - Tetanus: 2016  - Shingrix     Review of Systems   Constitutional:  Negative for activity change, appetite change, chills and fever.   Respiratory:  Negative for cough, chest tightness and shortness of breath.    Cardiovascular:  Negative for chest pain.   Gastrointestinal:  Negative for abdominal pain.   Genitourinary:  Negative for flank pain.   Neurological:  Negative for headaches.        BP 126/83   Pulse 77   Temp 97.8 F (36.6 C)   Ht 1.524 m (5')   Wt 61.7 kg (136 lb)   LMP 07/25/2022 (Approximate)   SpO2 98%   BMI 26.56 kg/m    Physical Exam  Constitutional:       General: She is not in acute distress.     Appearance: She is not toxic-appearing.   HENT:      Head: Normocephalic.   Eyes:      Pupils: Pupils are equal, round, and reactive to light.   Cardiovascular:      Rate and Rhythm: Normal rate and regular rhythm.      Heart sounds: No murmur heard.  Pulmonary:      Effort: Pulmonary effort is normal. No respiratory distress.      Breath sounds: No wheezing.    Chest:      Comments: No breast mass no axilla lymphadenopathy   Abdominal:      General: There is no distension.      Palpations: Abdomen is soft.      Tenderness: There is no abdominal tenderness.   Musculoskeletal:         General: Normal range of motion.   Skin:     General: Skin is warm and dry.      Coloration: Skin is not jaundiced.   Neurological:      General: No focal deficit present.      Mental Status: She is oriented to person, place, and time.   Psychiatric:         Mood and Affect: Mood normal.          1.  Annual physical exam  37 year old female. Physical exam benign.     Plan:   - Well balanced diet- Mediterranean diet   - Exercise goal 150 min/week  - Stress coping strategies reviewed   - Vision: UTD  - Dental: due  - SPF 30 and above  - Tdap due in 2026   - CBC without differential  - Comprehensive metabolic panel  - TSH  - Hemoglobin A1C  - Lipid panel    2. Hypothyroidism, unspecified type  Diagnosed in 2014. Levothyroxine .     Plan:   - f/u TSH     Nha-Han Teasia Zapf, DO  No follow-ups on file.

## 2022-08-04 NOTE — Progress Notes (Signed)
Have you seen any specialists/other providers since your last visit with [place]?    No    Arm preference verified?   Yes, no preference    Health Maintenance Due   Topic Date Due    Tetanus Ten-Year  Never done    HEPATITIS C SCREENING  Never done    PAP SMEAR  03/19/2017    COVID-19 Vaccine (5 - 2023-24 season) 12/18/2021

## 2022-08-05 ENCOUNTER — Encounter (INDEPENDENT_AMBULATORY_CARE_PROVIDER_SITE_OTHER): Payer: Self-pay | Admitting: Student in an Organized Health Care Education/Training Program

## 2022-08-05 LAB — CBC
Hematocrit: 41.8 % (ref 34.0–46.6)
Hemoglobin: 13.5 g/dL (ref 11.1–15.9)
MCH: 25.7 pg — ABNORMAL LOW (ref 26.6–33.0)
MCHC: 32.3 g/dL (ref 31.5–35.7)
MCV: 80 fL (ref 79–97)
Platelets: 329 10*3/uL (ref 150–450)
RBC: 5.26 x10E6/uL (ref 3.77–5.28)
RDW: 13.5 % (ref 11.7–15.4)
WBC: 11.6 10*3/uL — ABNORMAL HIGH (ref 3.4–10.8)

## 2022-08-05 LAB — TSH: TSH: 1.85 u[IU]/mL (ref 0.450–4.500)

## 2022-08-05 LAB — COMPREHENSIVE METABOLIC PANEL
ALT: 11 IU/L (ref 0–32)
AST (SGOT): 18 IU/L (ref 0–40)
Albumin/Globulin Ratio: 1.8 (ref 1.2–2.2)
Albumin: 4.4 g/dL (ref 3.9–4.9)
Alkaline Phosphatase: 80 IU/L (ref 44–121)
BUN / Creatinine Ratio: 13 (ref 9–23)
BUN: 9 mg/dL (ref 6–20)
Bilirubin, Total: 0.4 mg/dL (ref 0.0–1.2)
CO2: 21 mmol/L (ref 20–29)
Calcium: 9.5 mg/dL (ref 8.7–10.2)
Chloride: 102 mmol/L (ref 96–106)
Creatinine: 0.71 mg/dL (ref 0.57–1.00)
Globulin, Total: 2.5 g/dL (ref 1.5–4.5)
Glucose: 79 mg/dL (ref 70–99)
Potassium: 3.9 mmol/L (ref 3.5–5.2)
Protein, Total: 6.9 g/dL (ref 6.0–8.5)
Sodium: 139 mmol/L (ref 134–144)
eGFR: 112 mL/min/{1.73_m2} (ref >59)

## 2022-08-05 LAB — LIPID PANEL
Cholesterol / HDL Ratio: 3 ratio (ref 0.0–4.4)
Cholesterol: 189 mg/dL (ref 100–199)
HDL: 64 mg/dL (ref >39)
LDL Chol Calculated (NIH): 108 mg/dL — ABNORMAL HIGH (ref 0–99)
Triglycerides: 96 mg/dL (ref 0–149)
VLDL Calculated: 17 mg/dL (ref 5–40)

## 2022-08-05 LAB — HEMOGLOBIN A1C: Hemoglobin A1C: 5.7 % — ABNORMAL HIGH (ref 4.8–5.6)

## 2022-09-08 ENCOUNTER — Other Ambulatory Visit (INDEPENDENT_AMBULATORY_CARE_PROVIDER_SITE_OTHER): Payer: Self-pay | Admitting: Student in an Organized Health Care Education/Training Program

## 2022-09-08 DIAGNOSIS — E039 Hypothyroidism, unspecified: Secondary | ICD-10-CM

## 2022-09-10 MED ORDER — LEVOTHYROXINE SODIUM 50 MCG PO TABS
50.0000 ug | ORAL_TABLET | Freq: Every morning | ORAL | 0 refills | Status: DC
Start: 2022-09-10 — End: 2022-12-30

## 2022-12-30 ENCOUNTER — Other Ambulatory Visit (INDEPENDENT_AMBULATORY_CARE_PROVIDER_SITE_OTHER): Payer: Self-pay | Admitting: Student in an Organized Health Care Education/Training Program

## 2022-12-30 DIAGNOSIS — E039 Hypothyroidism, unspecified: Secondary | ICD-10-CM

## 2022-12-31 MED ORDER — LEVOTHYROXINE SODIUM 50 MCG PO TABS
50.0000 ug | ORAL_TABLET | Freq: Every morning | ORAL | 0 refills | Status: AC
Start: 2022-12-31 — End: 2023-03-31

## 2024-01-19 ENCOUNTER — Other Ambulatory Visit (INDEPENDENT_AMBULATORY_CARE_PROVIDER_SITE_OTHER): Payer: Self-pay | Admitting: Student in an Organized Health Care Education/Training Program
